# Patient Record
Sex: Female | Born: 1935 | Race: White | Hispanic: No | Marital: Married | Smoking: Never smoker
Health system: Southern US, Community
[De-identification: ages and names within clinical notes are randomized; demographics above are authoritative.]

## PROBLEM LIST (undated history)

## (undated) ENCOUNTER — Emergency Department: Discharge status: Absent without leave | Payer: Medicare HMO

## (undated) DIAGNOSIS — I10 Essential (primary) hypertension: Secondary | ICD-10-CM

## (undated) DIAGNOSIS — I2510 Atherosclerotic heart disease of native coronary artery without angina pectoris: Secondary | ICD-10-CM

## (undated) DIAGNOSIS — E7800 Pure hypercholesterolemia, unspecified: Secondary | ICD-10-CM

## (undated) HISTORY — PX: ABDOMINAL HYSTERECTOMY: SHX81

---

## 2003-11-03 ENCOUNTER — Observation Stay (HOSPITAL_COMMUNITY): Admission: EM | Admit: 2003-11-03 | Discharge: 2003-11-05 | Payer: Self-pay | Admitting: *Deleted

## 2006-06-21 ENCOUNTER — Ambulatory Visit: Payer: Self-pay | Admitting: Internal Medicine

## 2007-09-24 ENCOUNTER — Ambulatory Visit: Payer: Self-pay | Admitting: Cardiovascular Disease

## 2007-12-18 ENCOUNTER — Ambulatory Visit: Payer: Self-pay | Admitting: Internal Medicine

## 2009-02-06 ENCOUNTER — Ambulatory Visit: Payer: Self-pay | Admitting: Internal Medicine

## 2010-02-10 ENCOUNTER — Ambulatory Visit: Payer: Self-pay | Admitting: Internal Medicine

## 2011-02-23 ENCOUNTER — Ambulatory Visit: Payer: Self-pay | Admitting: Internal Medicine

## 2012-03-06 ENCOUNTER — Ambulatory Visit: Payer: Self-pay | Admitting: Internal Medicine

## 2013-03-07 ENCOUNTER — Ambulatory Visit: Payer: Self-pay | Admitting: Internal Medicine

## 2014-03-10 ENCOUNTER — Ambulatory Visit: Payer: Self-pay | Admitting: Internal Medicine

## 2015-01-28 ENCOUNTER — Other Ambulatory Visit: Payer: Self-pay | Admitting: Internal Medicine

## 2015-01-28 DIAGNOSIS — Z1231 Encounter for screening mammogram for malignant neoplasm of breast: Secondary | ICD-10-CM

## 2015-03-16 ENCOUNTER — Other Ambulatory Visit: Payer: Self-pay | Admitting: Internal Medicine

## 2015-03-16 ENCOUNTER — Ambulatory Visit
Admission: RE | Admit: 2015-03-16 | Discharge: 2015-03-16 | Disposition: A | Discharge status: Home / Self Care | Payer: Medicare HMO | Source: Ambulatory Visit | Attending: Internal Medicine | Admitting: Internal Medicine

## 2015-03-16 DIAGNOSIS — Z1231 Encounter for screening mammogram for malignant neoplasm of breast: Secondary | ICD-10-CM

## 2016-02-09 ENCOUNTER — Other Ambulatory Visit: Payer: Self-pay | Admitting: Internal Medicine

## 2016-02-09 DIAGNOSIS — Z1231 Encounter for screening mammogram for malignant neoplasm of breast: Secondary | ICD-10-CM

## 2016-03-16 ENCOUNTER — Ambulatory Visit
Admission: RE | Admit: 2016-03-16 | Discharge: 2016-03-16 | Disposition: A | Discharge status: Home / Self Care | Payer: Medicare HMO | Source: Ambulatory Visit | Attending: Internal Medicine | Admitting: Internal Medicine

## 2016-03-16 DIAGNOSIS — Z1231 Encounter for screening mammogram for malignant neoplasm of breast: Secondary | ICD-10-CM | POA: Diagnosis not present

## 2016-12-12 ENCOUNTER — Other Ambulatory Visit: Payer: Self-pay | Admitting: Internal Medicine

## 2016-12-12 DIAGNOSIS — Z1231 Encounter for screening mammogram for malignant neoplasm of breast: Secondary | ICD-10-CM

## 2017-03-21 ENCOUNTER — Other Ambulatory Visit: Payer: Self-pay | Admitting: Internal Medicine

## 2017-03-21 ENCOUNTER — Ambulatory Visit
Admission: RE | Admit: 2017-03-21 | Discharge: 2017-03-21 | Disposition: A | Discharge status: Home / Self Care | Payer: Medicare HMO | Source: Ambulatory Visit | Attending: Internal Medicine | Admitting: Internal Medicine

## 2017-03-21 DIAGNOSIS — Z1231 Encounter for screening mammogram for malignant neoplasm of breast: Secondary | ICD-10-CM | POA: Diagnosis not present

## 2018-01-22 ENCOUNTER — Other Ambulatory Visit: Payer: Self-pay | Admitting: Family

## 2018-01-22 DIAGNOSIS — Z1231 Encounter for screening mammogram for malignant neoplasm of breast: Secondary | ICD-10-CM

## 2018-03-22 ENCOUNTER — Ambulatory Visit
Admission: RE | Admit: 2018-03-22 | Discharge: 2018-03-22 | Disposition: A | Discharge status: Home / Self Care | Payer: Medicare HMO | Source: Ambulatory Visit | Attending: Family | Admitting: Family

## 2018-03-22 DIAGNOSIS — Z1231 Encounter for screening mammogram for malignant neoplasm of breast: Secondary | ICD-10-CM | POA: Insufficient documentation

## 2019-01-21 ENCOUNTER — Other Ambulatory Visit: Payer: Self-pay | Admitting: Internal Medicine

## 2019-01-21 DIAGNOSIS — Z1231 Encounter for screening mammogram for malignant neoplasm of breast: Secondary | ICD-10-CM

## 2019-03-25 ENCOUNTER — Ambulatory Visit
Admission: RE | Admit: 2019-03-25 | Discharge: 2019-03-25 | Disposition: A | Discharge status: Home / Self Care | Payer: Medicare HMO | Source: Ambulatory Visit | Attending: Internal Medicine | Admitting: Internal Medicine

## 2019-03-25 DIAGNOSIS — Z1231 Encounter for screening mammogram for malignant neoplasm of breast: Secondary | ICD-10-CM | POA: Diagnosis not present

## 2020-02-13 ENCOUNTER — Other Ambulatory Visit: Payer: Self-pay | Admitting: Internal Medicine

## 2020-02-13 DIAGNOSIS — Z1231 Encounter for screening mammogram for malignant neoplasm of breast: Secondary | ICD-10-CM

## 2020-03-25 ENCOUNTER — Other Ambulatory Visit: Payer: Self-pay

## 2020-03-25 ENCOUNTER — Ambulatory Visit
Admission: RE | Admit: 2020-03-25 | Discharge: 2020-03-25 | Disposition: A | Discharge status: Home / Self Care | Payer: Medicare HMO | Source: Ambulatory Visit | Attending: Internal Medicine | Admitting: Internal Medicine

## 2020-03-25 DIAGNOSIS — Z1231 Encounter for screening mammogram for malignant neoplasm of breast: Secondary | ICD-10-CM | POA: Insufficient documentation

## 2020-09-24 DIAGNOSIS — R7302 Impaired glucose tolerance (oral): Secondary | ICD-10-CM | POA: Diagnosis not present

## 2020-09-24 DIAGNOSIS — I2510 Atherosclerotic heart disease of native coronary artery without angina pectoris: Secondary | ICD-10-CM | POA: Diagnosis not present

## 2020-09-24 DIAGNOSIS — E559 Vitamin D deficiency, unspecified: Secondary | ICD-10-CM | POA: Diagnosis not present

## 2020-09-24 DIAGNOSIS — E782 Mixed hyperlipidemia: Secondary | ICD-10-CM | POA: Diagnosis not present

## 2020-09-24 DIAGNOSIS — I10 Essential (primary) hypertension: Secondary | ICD-10-CM | POA: Diagnosis not present

## 2020-11-13 DIAGNOSIS — R002 Palpitations: Secondary | ICD-10-CM | POA: Diagnosis not present

## 2020-11-13 DIAGNOSIS — I10 Essential (primary) hypertension: Secondary | ICD-10-CM | POA: Diagnosis not present

## 2020-11-13 DIAGNOSIS — I340 Nonrheumatic mitral (valve) insufficiency: Secondary | ICD-10-CM | POA: Diagnosis not present

## 2020-11-13 DIAGNOSIS — E782 Mixed hyperlipidemia: Secondary | ICD-10-CM | POA: Diagnosis not present

## 2020-11-13 DIAGNOSIS — E669 Obesity, unspecified: Secondary | ICD-10-CM | POA: Diagnosis not present

## 2020-11-13 DIAGNOSIS — I2510 Atherosclerotic heart disease of native coronary artery without angina pectoris: Secondary | ICD-10-CM | POA: Diagnosis not present

## 2020-12-24 DIAGNOSIS — I340 Nonrheumatic mitral (valve) insufficiency: Secondary | ICD-10-CM | POA: Diagnosis not present

## 2020-12-28 DIAGNOSIS — I2510 Atherosclerotic heart disease of native coronary artery without angina pectoris: Secondary | ICD-10-CM | POA: Diagnosis not present

## 2020-12-31 DIAGNOSIS — E782 Mixed hyperlipidemia: Secondary | ICD-10-CM | POA: Diagnosis not present

## 2020-12-31 DIAGNOSIS — R0602 Shortness of breath: Secondary | ICD-10-CM | POA: Diagnosis not present

## 2020-12-31 DIAGNOSIS — I2510 Atherosclerotic heart disease of native coronary artery without angina pectoris: Secondary | ICD-10-CM | POA: Diagnosis not present

## 2020-12-31 DIAGNOSIS — R002 Palpitations: Secondary | ICD-10-CM | POA: Diagnosis not present

## 2020-12-31 DIAGNOSIS — I10 Essential (primary) hypertension: Secondary | ICD-10-CM | POA: Diagnosis not present

## 2021-03-15 ENCOUNTER — Other Ambulatory Visit: Payer: Self-pay | Admitting: Internal Medicine

## 2021-03-15 DIAGNOSIS — Z1231 Encounter for screening mammogram for malignant neoplasm of breast: Secondary | ICD-10-CM

## 2021-03-15 DIAGNOSIS — I10 Essential (primary) hypertension: Secondary | ICD-10-CM | POA: Diagnosis not present

## 2021-03-15 DIAGNOSIS — I2510 Atherosclerotic heart disease of native coronary artery without angina pectoris: Secondary | ICD-10-CM | POA: Diagnosis not present

## 2021-03-15 DIAGNOSIS — E559 Vitamin D deficiency, unspecified: Secondary | ICD-10-CM | POA: Diagnosis not present

## 2021-03-15 DIAGNOSIS — E782 Mixed hyperlipidemia: Secondary | ICD-10-CM | POA: Diagnosis not present

## 2021-03-15 DIAGNOSIS — R7302 Impaired glucose tolerance (oral): Secondary | ICD-10-CM | POA: Diagnosis not present

## 2021-03-16 DIAGNOSIS — H40003 Preglaucoma, unspecified, bilateral: Secondary | ICD-10-CM | POA: Diagnosis not present

## 2021-03-22 DIAGNOSIS — I10 Essential (primary) hypertension: Secondary | ICD-10-CM | POA: Diagnosis not present

## 2021-03-22 DIAGNOSIS — R7302 Impaired glucose tolerance (oral): Secondary | ICD-10-CM | POA: Diagnosis not present

## 2021-03-22 DIAGNOSIS — I2510 Atherosclerotic heart disease of native coronary artery without angina pectoris: Secondary | ICD-10-CM | POA: Diagnosis not present

## 2021-03-22 DIAGNOSIS — E782 Mixed hyperlipidemia: Secondary | ICD-10-CM | POA: Diagnosis not present

## 2021-03-22 DIAGNOSIS — E559 Vitamin D deficiency, unspecified: Secondary | ICD-10-CM | POA: Diagnosis not present

## 2021-03-24 DIAGNOSIS — Z0100 Encounter for examination of eyes and vision without abnormal findings: Secondary | ICD-10-CM | POA: Diagnosis not present

## 2021-03-24 DIAGNOSIS — H2513 Age-related nuclear cataract, bilateral: Secondary | ICD-10-CM | POA: Diagnosis not present

## 2021-03-26 ENCOUNTER — Ambulatory Visit
Admission: RE | Admit: 2021-03-26 | Discharge: 2021-03-26 | Disposition: A | Discharge status: Home / Self Care | Payer: Medicare HMO | Source: Ambulatory Visit | Attending: Internal Medicine | Admitting: Internal Medicine

## 2021-03-26 ENCOUNTER — Other Ambulatory Visit: Payer: Self-pay

## 2021-03-26 DIAGNOSIS — Z1231 Encounter for screening mammogram for malignant neoplasm of breast: Secondary | ICD-10-CM | POA: Insufficient documentation

## 2021-04-01 DIAGNOSIS — I10 Essential (primary) hypertension: Secondary | ICD-10-CM | POA: Diagnosis not present

## 2021-04-01 DIAGNOSIS — E669 Obesity, unspecified: Secondary | ICD-10-CM | POA: Diagnosis not present

## 2021-04-01 DIAGNOSIS — I340 Nonrheumatic mitral (valve) insufficiency: Secondary | ICD-10-CM | POA: Diagnosis not present

## 2021-04-01 DIAGNOSIS — I2510 Atherosclerotic heart disease of native coronary artery without angina pectoris: Secondary | ICD-10-CM | POA: Diagnosis not present

## 2021-04-01 DIAGNOSIS — R002 Palpitations: Secondary | ICD-10-CM | POA: Diagnosis not present

## 2021-04-01 DIAGNOSIS — E782 Mixed hyperlipidemia: Secondary | ICD-10-CM | POA: Diagnosis not present

## 2021-08-02 DIAGNOSIS — E669 Obesity, unspecified: Secondary | ICD-10-CM | POA: Diagnosis not present

## 2021-08-02 DIAGNOSIS — I10 Essential (primary) hypertension: Secondary | ICD-10-CM | POA: Diagnosis not present

## 2021-08-02 DIAGNOSIS — I2510 Atherosclerotic heart disease of native coronary artery without angina pectoris: Secondary | ICD-10-CM | POA: Diagnosis not present

## 2021-08-02 DIAGNOSIS — E782 Mixed hyperlipidemia: Secondary | ICD-10-CM | POA: Diagnosis not present

## 2021-08-02 DIAGNOSIS — R002 Palpitations: Secondary | ICD-10-CM | POA: Diagnosis not present

## 2021-08-02 DIAGNOSIS — I340 Nonrheumatic mitral (valve) insufficiency: Secondary | ICD-10-CM | POA: Diagnosis not present

## 2021-08-28 ENCOUNTER — Encounter: Payer: Self-pay | Admitting: Intensive Care

## 2021-08-28 ENCOUNTER — Emergency Department
Admission: EM | Admit: 2021-08-28 | Discharge: 2021-08-28 | Disposition: A | Discharge status: Home / Self Care | Payer: Medicare HMO | Attending: Emergency Medicine | Admitting: Emergency Medicine

## 2021-08-28 ENCOUNTER — Other Ambulatory Visit: Payer: Self-pay

## 2021-08-28 DIAGNOSIS — I10 Essential (primary) hypertension: Secondary | ICD-10-CM | POA: Diagnosis not present

## 2021-08-28 DIAGNOSIS — U071 COVID-19: Secondary | ICD-10-CM | POA: Diagnosis not present

## 2021-08-28 DIAGNOSIS — R197 Diarrhea, unspecified: Secondary | ICD-10-CM | POA: Diagnosis present

## 2021-08-28 HISTORY — DX: Pure hypercholesterolemia, unspecified: E78.00

## 2021-08-28 HISTORY — DX: Essential (primary) hypertension: I10

## 2021-08-28 LAB — COMPREHENSIVE METABOLIC PANEL
ALT: 42 U/L (ref 0–44)
AST: 49 U/L — ABNORMAL HIGH (ref 15–41)
Albumin: 3.8 g/dL (ref 3.5–5.0)
Alkaline Phosphatase: 68 U/L (ref 38–126)
Anion gap: 7 (ref 5–15)
BUN: 16 mg/dL (ref 8–23)
CO2: 23 mmol/L (ref 22–32)
Calcium: 8.8 mg/dL — ABNORMAL LOW (ref 8.9–10.3)
Chloride: 104 mmol/L (ref 98–111)
Creatinine, Ser: 1.53 mg/dL — ABNORMAL HIGH (ref 0.44–1.00)
GFR, Estimated: 33 mL/min — ABNORMAL LOW (ref >60)
Glucose, Bld: 115 mg/dL — ABNORMAL HIGH (ref 70–99)
Potassium: 3.9 mmol/L (ref 3.5–5.1)
Sodium: 134 mmol/L — ABNORMAL LOW (ref 135–145)
Total Bilirubin: 0.6 mg/dL (ref 0.3–1.2)
Total Protein: 7.1 g/dL (ref 6.5–8.1)

## 2021-08-28 LAB — CBC
HCT: 41.7 % (ref 36.0–46.0)
Hemoglobin: 13.3 g/dL (ref 12.0–15.0)
MCH: 30.7 pg (ref 26.0–34.0)
MCHC: 31.9 g/dL (ref 30.0–36.0)
MCV: 96.3 fL (ref 80.0–100.0)
Platelets: 218 10*3/uL (ref 150–400)
RBC: 4.33 MIL/uL (ref 3.87–5.11)
RDW: 12.9 % (ref 11.5–15.5)
WBC: 5.5 10*3/uL (ref 4.0–10.5)
nRBC: 0 % (ref 0.0–0.2)

## 2021-08-28 LAB — LIPASE, BLOOD: Lipase: 72 U/L — ABNORMAL HIGH (ref 11–51)

## 2021-08-28 LAB — RESP PANEL BY RT-PCR (FLU A&B, COVID) ARPGX2
Influenza A by PCR: NEGATIVE
Influenza B by PCR: NEGATIVE
SARS Coronavirus 2 by RT PCR: POSITIVE — AB

## 2021-08-28 MED ORDER — ACETAMINOPHEN 500 MG PO TABS
1000.0000 mg | ORAL_TABLET | Freq: Once | ORAL | Status: AC
  Administered 2021-08-28: 1000 mg via ORAL
  Filled 2021-08-28: qty 2

## 2021-08-28 MED ORDER — SODIUM CHLORIDE 0.9 % IV BOLUS
1000.0000 mL | Freq: Once | INTRAVENOUS | Status: AC
  Administered 2021-08-28: 1000 mL via INTRAVENOUS

## 2021-08-28 NOTE — ED Provider Notes (Signed)
? ?[facility] ?Provider Note ? ? ? Event Date/Time  ? First MD Initiated Contact with Patient 08/28/21 1217   ?  (approximate) ? ? ?History  ? ?Cough and Diarrhea ? ? ?HPI ? ?Alexa Coleman is a 88 y.o. female with past medical history of hypertension hyperlipidemia presents with body aches diarrhea nasal congestion and fatigue.  Symptoms started last Saturday about a week ago.  She for started having cough and sore throat.  Then later in the week she developed multiple episodes of diarrhea.  Described as watery nonbloody diarrhea about 10 episodes per day.  This is significantly slowed down and has not had diarrhea since yesterday.  Has had some nausea but no vomiting denies significant abdominal pain no urinary symptoms.  Did have subjective fever did not take her temperature.  Denies ongoing sore throat cough or shortness of breath.  Patient's husband has the same symptoms.  She is feeling just generally weak and fatigued somewhat foggy. ?  ? ?Past Medical History:  ?Diagnosis Date  ? High cholesterol   ? Hypertension   ? ? ?There are no problems to display for this patient. ? ? ? ?Physical Exam  ?Triage Vital Signs: ?ED Triage Vitals  ?Enc Vitals Group  ?   BP 08/28/21 1148 (!) 152/61  ?   Pulse Rate 08/28/21 1148 63  ?   Resp 08/28/21 1148 16  ?   Temp 08/28/21 1148 98 ?F (36.7 ?C)  ?   Temp Source 08/28/21 1148 Oral  ?   SpO2 08/28/21 1148 95 %  ?   Weight 08/28/21 1149 162 lb (73.5 kg)  ?   Height 08/28/21 1149 5\' 2"  (1.575 m)  ?   Head Circumference --   ?   Peak Flow --   ?   Pain Score 08/28/21 1149 0  ?   Pain Loc --   ?   Pain Edu? --   ?   Excl. in GC? --   ? ? ?Most recent vital signs: ?Vitals:  ? 08/28/21 1148  ?BP: (!) 152/61  ?Pulse: 63  ?Resp: 16  ?Temp: 98 ?F (36.7 ?C)  ?SpO2: 95%  ? ? ? ?General: Awake, no distress.  ?CV:  Good peripheral perfusion.  ?Resp:  Normal effort.  Lungs are clear no increased work of breathing ?Abd:  No distention.  Diminished soft and nontender  throughout ?Neuro:             Awake, Alert, Oriented x 3  ?Other:  Normal posterior oropharynx, dry mucous membranes ? ? ?ED Results / Procedures / Treatments  ?Labs ?(all labs ordered are listed, but only abnormal results are displayed) ?Labs Reviewed  ?RESP PANEL BY RT-PCR (FLU A&B, COVID) ARPGX2 - Abnormal; Notable for the following components:  ?    Result Value  ? SARS Coronavirus 2 by RT PCR POSITIVE (*)   ? All other components within normal limits  ?LIPASE, BLOOD - Abnormal; Notable for the following components:  ? Lipase 72 (*)   ? All other components within normal limits  ?COMPREHENSIVE METABOLIC PANEL - Abnormal; Notable for the following components:  ? Sodium 134 (*)   ? Glucose, Bld 115 (*)   ? Creatinine, Ser 1.53 (*)   ? Calcium 8.8 (*)   ? AST 49 (*)   ? GFR, Estimated 33 (*)   ? All other components within normal limits  ?CBC  ?URINALYSIS, ROUTINE W REFLEX MICROSCOPIC  ? ? ? ?EKG ? ? ? ?  RADIOLOGY ? ? ? ?PROCEDURES: ? ?Critical Care performed: No ? ?Procedures ? ? ?MEDICATIONS ORDERED IN ED: ?Medications  ?sodium chloride 0.9 % bolus 1,000 mL (1,000 mLs Intravenous New Bag/Given 08/28/21 1252)  ?acetaminophen (TYLENOL) tablet 1,000 mg (1,000 mg Oral Given 08/28/21 1253)  ? ? ? ?IMPRESSION / MDM / ASSESSMENT AND PLAN / ED COURSE  ?I reviewed the triage vital signs and the nursing notes. ?             ?               ? ?Differential diagnosis includes, but is not limited to, viral respiratory illness, GI virus, bacterial diarrhea, AKI, dehydration ? ?Patient is a 88 year old female presenting with multiple nonspecific symptoms including diarrhea cough congestion sore throat fatigue weakness.  Patient's husband has really the exact same symptoms.  She has been sick for about a week and seems to be improving.  Initially had more respiratory symptoms these have improved and then later in the week had multiple episodes of diarrhea per day but has not had diarrhea since yesterday.  She still tolerating p.o.  but has not eaten much feels just generally weak and fatigued.  Vital signs within normal limits he appears well and nontoxic but does appear dry.  Reviewed her labs which are overall reassuring normal hemoglobin no white count, creatinine is 1.53 we have nothing to compare to.  Will give Tylenol and a liter of fluid.  Ultimately I suspect viral illness we will send for COVID and flu.  She seems to be at the end of her illness and I think with some hydration that she will likely be able to be discharged. ? ?Patient is COVID-positive as is her husband.  Given her stable vitals and the fact that she is actually improving and is at the end of her illness I think she is appropriate for outpatient management.  Recommended staying hydrated and Tylenol for body aches and ear pain.  We discussed return precautions. ?  ? ? ?FINAL CLINICAL IMPRESSION(S) / ED DIAGNOSES  ? ?Final diagnoses:  ?COVID-19  ? ? ? ?Rx / DC Orders  ? ?ED Discharge Orders   ? ? None  ? ?  ? ? ? ?Note:  This document was prepared using Dragon voice recognition software and may include unintentional dictation errors. ?  ?Georga Hacking, MD ?08/28/21 1419 ? ?

## 2021-08-28 NOTE — ED Notes (Signed)
Pt to ED for diarrhea since Monday, last episode yesterday. Pt has cough since about 1 week and feels weak and has body aches.  Pt was having about 10 episodes diarrhea/day when it first started.  ?

## 2021-08-28 NOTE — ED Triage Notes (Signed)
Patient c/o cough and diarrhea X1 week. Reports fever in beginning that has now subsided ?

## 2021-08-28 NOTE — ED Notes (Signed)
See triage note  presents with body aches,cough and diarrhea since last Saturday  had low grade temp several days ago but is currently afebrile   last diarrhea stool was yesterday ?

## 2021-09-09 DIAGNOSIS — I2510 Atherosclerotic heart disease of native coronary artery without angina pectoris: Secondary | ICD-10-CM | POA: Diagnosis not present

## 2021-09-09 DIAGNOSIS — I10 Essential (primary) hypertension: Secondary | ICD-10-CM | POA: Diagnosis not present

## 2021-09-09 DIAGNOSIS — E669 Obesity, unspecified: Secondary | ICD-10-CM | POA: Diagnosis not present

## 2021-09-09 DIAGNOSIS — R197 Diarrhea, unspecified: Secondary | ICD-10-CM | POA: Diagnosis not present

## 2021-09-09 DIAGNOSIS — E782 Mixed hyperlipidemia: Secondary | ICD-10-CM | POA: Diagnosis not present

## 2021-09-09 DIAGNOSIS — R110 Nausea: Secondary | ICD-10-CM | POA: Diagnosis not present

## 2021-09-09 DIAGNOSIS — U071 COVID-19: Secondary | ICD-10-CM | POA: Diagnosis not present

## 2021-09-23 DIAGNOSIS — I10 Essential (primary) hypertension: Secondary | ICD-10-CM | POA: Diagnosis not present

## 2021-09-23 DIAGNOSIS — E782 Mixed hyperlipidemia: Secondary | ICD-10-CM | POA: Diagnosis not present

## 2021-09-23 DIAGNOSIS — E559 Vitamin D deficiency, unspecified: Secondary | ICD-10-CM | POA: Diagnosis not present

## 2021-09-23 DIAGNOSIS — R7302 Impaired glucose tolerance (oral): Secondary | ICD-10-CM | POA: Diagnosis not present

## 2021-12-02 DIAGNOSIS — I10 Essential (primary) hypertension: Secondary | ICD-10-CM | POA: Diagnosis not present

## 2021-12-02 DIAGNOSIS — E669 Obesity, unspecified: Secondary | ICD-10-CM | POA: Diagnosis not present

## 2021-12-02 DIAGNOSIS — R002 Palpitations: Secondary | ICD-10-CM | POA: Diagnosis not present

## 2021-12-02 DIAGNOSIS — I2510 Atherosclerotic heart disease of native coronary artery without angina pectoris: Secondary | ICD-10-CM | POA: Diagnosis not present

## 2021-12-02 DIAGNOSIS — I340 Nonrheumatic mitral (valve) insufficiency: Secondary | ICD-10-CM | POA: Diagnosis not present

## 2021-12-02 DIAGNOSIS — E782 Mixed hyperlipidemia: Secondary | ICD-10-CM | POA: Diagnosis not present

## 2021-12-31 DIAGNOSIS — R7302 Impaired glucose tolerance (oral): Secondary | ICD-10-CM | POA: Diagnosis not present

## 2021-12-31 DIAGNOSIS — I10 Essential (primary) hypertension: Secondary | ICD-10-CM | POA: Diagnosis not present

## 2021-12-31 DIAGNOSIS — Z0000 Encounter for general adult medical examination without abnormal findings: Secondary | ICD-10-CM | POA: Diagnosis not present

## 2021-12-31 DIAGNOSIS — I2510 Atherosclerotic heart disease of native coronary artery without angina pectoris: Secondary | ICD-10-CM | POA: Diagnosis not present

## 2021-12-31 DIAGNOSIS — E782 Mixed hyperlipidemia: Secondary | ICD-10-CM | POA: Diagnosis not present

## 2021-12-31 DIAGNOSIS — M810 Age-related osteoporosis without current pathological fracture: Secondary | ICD-10-CM | POA: Diagnosis not present

## 2022-03-28 DIAGNOSIS — E559 Vitamin D deficiency, unspecified: Secondary | ICD-10-CM | POA: Diagnosis not present

## 2022-03-28 DIAGNOSIS — E782 Mixed hyperlipidemia: Secondary | ICD-10-CM | POA: Diagnosis not present

## 2022-03-28 DIAGNOSIS — R7302 Impaired glucose tolerance (oral): Secondary | ICD-10-CM | POA: Diagnosis not present

## 2022-03-28 DIAGNOSIS — I10 Essential (primary) hypertension: Secondary | ICD-10-CM | POA: Diagnosis not present

## 2022-04-04 DIAGNOSIS — I2510 Atherosclerotic heart disease of native coronary artery without angina pectoris: Secondary | ICD-10-CM | POA: Diagnosis not present

## 2022-04-04 DIAGNOSIS — I10 Essential (primary) hypertension: Secondary | ICD-10-CM | POA: Diagnosis not present

## 2022-04-04 DIAGNOSIS — E782 Mixed hyperlipidemia: Secondary | ICD-10-CM | POA: Diagnosis not present

## 2022-04-04 DIAGNOSIS — R002 Palpitations: Secondary | ICD-10-CM | POA: Diagnosis not present

## 2022-04-04 DIAGNOSIS — I340 Nonrheumatic mitral (valve) insufficiency: Secondary | ICD-10-CM | POA: Diagnosis not present

## 2022-04-04 DIAGNOSIS — E669 Obesity, unspecified: Secondary | ICD-10-CM | POA: Diagnosis not present

## 2022-04-07 DIAGNOSIS — H2513 Age-related nuclear cataract, bilateral: Secondary | ICD-10-CM | POA: Diagnosis not present

## 2022-05-02 DIAGNOSIS — I10 Essential (primary) hypertension: Secondary | ICD-10-CM | POA: Diagnosis not present

## 2022-05-02 DIAGNOSIS — R7302 Impaired glucose tolerance (oral): Secondary | ICD-10-CM | POA: Diagnosis not present

## 2022-05-02 DIAGNOSIS — I2510 Atherosclerotic heart disease of native coronary artery without angina pectoris: Secondary | ICD-10-CM | POA: Diagnosis not present

## 2022-05-02 DIAGNOSIS — E782 Mixed hyperlipidemia: Secondary | ICD-10-CM | POA: Diagnosis not present

## 2022-06-06 ENCOUNTER — Other Ambulatory Visit: Payer: Self-pay | Admitting: Internal Medicine

## 2022-08-05 ENCOUNTER — Ambulatory Visit: Payer: Medicare HMO | Admitting: Cardiovascular Disease

## 2022-08-05 ENCOUNTER — Encounter: Payer: Self-pay | Admitting: Cardiovascular Disease

## 2022-08-05 VITALS — BP 134/80 | HR 68 | Ht 62.0 in | Wt 168.2 lb

## 2022-08-05 DIAGNOSIS — E782 Mixed hyperlipidemia: Secondary | ICD-10-CM | POA: Diagnosis not present

## 2022-08-05 DIAGNOSIS — I2510 Atherosclerotic heart disease of native coronary artery without angina pectoris: Secondary | ICD-10-CM

## 2022-08-05 DIAGNOSIS — I340 Nonrheumatic mitral (valve) insufficiency: Secondary | ICD-10-CM

## 2022-08-05 DIAGNOSIS — I10 Essential (primary) hypertension: Secondary | ICD-10-CM

## 2022-08-05 NOTE — Progress Notes (Signed)
Cardiology Office Note   Date:  08/05/2022   ID:  Alexa Coleman, DOB March 28, 1934, MRN [id number]  PCP:  Margaretann Loveless, MD  Cardiologist:  Adrian Blackwater, MD      History of Present Illness: Alexa Coleman is a 89 y.o. female who presents for  Chief Complaint  Patient presents with   Follow-up    4 month follow up    No chest pain or SOB      Past Medical History:  Diagnosis Date   High cholesterol    Hypertension      Past Surgical History:  Procedure Laterality Date   ABDOMINAL HYSTERECTOMY       Current Outpatient Medications  Medication Sig Dispense Refill   alendronate (FOSAMAX) 70 MG tablet Take 70 mg by mouth once a week. Take with a full glass of water on an empty stomach.     atorvastatin (LIPITOR) 40 MG tablet Take 40 mg by mouth daily.     clopidogrel (PLAVIX) 75 MG tablet Take 75 mg by mouth daily.     losartan (COZAAR) 100 MG tablet Take 100 mg by mouth daily.     metoprolol succinate (TOPROL-XL) 50 MG 24 hr tablet Take 50 mg by mouth daily. Take with or immediately following a meal.     Vitamin D, Ergocalciferol, (DRISDOL) 1.25 MG (50000 UNIT) CAPS capsule TAKE 1 CAPSULE ONE TIME WEEKLY 12 capsule 3   No current facility-administered medications for this visit.    Allergies:   Patient has no known allergies.    Social History:   reports that she has never smoked. She has never used smokeless tobacco. She reports that she does not drink alcohol and does not use drugs.   Family History:  family history is not on file.    ROS:     Review of Systems  Constitutional: Negative.   HENT: Negative.    Eyes: Negative.   Respiratory: Negative.    Gastrointestinal: Negative.   Genitourinary: Negative.   Musculoskeletal: Negative.   Skin: Negative.   Neurological: Negative.   Endo/Heme/Allergies: Negative.   Psychiatric/Behavioral: Negative.    All other systems reviewed and are negative.     All other systems are reviewed and negative.     PHYSICAL EXAM: VS:  BP 134/80   Pulse 68   Ht 5\' 2"  (1.575 m)   Wt 168 lb 3.2 oz (76.3 kg)   SpO2 96%   BMI 30.76 kg/m  , BMI Body mass index is 30.76 kg/m. Last weight:  Wt Readings from Last 3 Encounters:  08/05/22 168 lb 3.2 oz (76.3 kg)  08/28/21 162 lb (73.5 kg)     Physical Exam Constitutional:      Appearance: Normal appearance.  Cardiovascular:     Rate and Rhythm: Normal rate and regular rhythm.     Heart sounds: Normal heart sounds.  Pulmonary:     Effort: Pulmonary effort is normal.     Breath sounds: Normal breath sounds.  Musculoskeletal:     Right lower leg: No edema.     Left lower leg: No edema.  Neurological:     Mental Status: She is alert.       EKG:   Recent Labs: 08/28/2021: ALT 42; BUN 16; Creatinine, Ser 1.53; Hemoglobin 13.3; Platelets 218; Potassium 3.9; Sodium 134    Lipid Panel No results found for: "CHOL", "TRIG", "HDL", "CHOLHDL", "VLDL", "LDLCALC", "LDLDIRECT"   TESTS  [facility] [address] [place], [place] [address] [phone] STUDY:  Gated Stress / Rest Myocardial Perfusion Imaging Tomographic (SPECT) Including attenuation correction Wall Motion, Left Ventricular Ejection Fraction By Gated Technique.Treadmill Stress Test. SEX: Female  WEIGHT: 183 lbs  HEIGHT: 72 in   ARMS UP: YES/NO                                                                        REFERRING PHYSICIAN: Dr.Danni Shima Welton Flakes                                                                                                                                                                                                                       INDICATION FOR STUDY: CAD                                                                                                                                                                                                                      TECHNIQUE:  Approximately 20 minutes following the intravenous administration of 9.5 mCi of Tc-28m Sestamibi after stress testing in a reclined supine position with arms above their head if able to do so, gated SPECT imaging of the heart was performed. After  about a 2hr break, the patient was injected intravenously with 32.3 mCi of Tc-65m Sestamibi.  Approximately 45 minutes later in the same position as stress imaging SPECT rest imaging of the heart was performed.  STRESS BY:  Adrian Blackwater, MD PROTOCOL:  Smitty Cords                                                                                       MAX PRED HR: 134                   85%: 114               75%: 101                                                                                                                   RESTING BP:  132/72  RESTING HR: 60  PEAK BP:  164/86   PEAK HR:  117 (87%)                                                                   EXERCISE DURATION: 6:00                                             METS: 7.0     REASON FOR TEST TERMINATION: Target reached/1 min post injection                                                                                                                                  SYMPTOMS: None  [facility] TREADMILL SCORE: 6  RISK: Low                                                                                                                                                                                                            EKG RESULTS: NSR. 89/min. RBBB. Non specific ST/T wave changes, no significant ST changes at peak exercise.                                                             IMAGE QUALITY: Good  PERFUSION/WALL MOTION FINDINGS:  EF = 57%. Small mild fixed Basal, mid, apical inferior wall defects, apex (17) defect, normal wall motion.                                                                          IMPRESSION:  Possible ischemia in the RCA territory with normal LVEF, correlate clinically.                                                                                                                                                                                                                                                                                        Adrian Blackwater, MD Stress Interpreting Physician / Nuclear Interpreting Physician                         Adrian Blackwater MD  Electronically signed by: Adrian Blackwater     Date: 07/16/2021 11:19 REASON FOR VISIT  Visit for: Echocardiogram/I 10  Sex:   Female   wt=  186  lbs.  BP=120/68  Height= 71   inches.        TESTS  Imaging: Echocardiogram:  An echocardiogram in (2-d) mode was performed and in Doppler mode with color flow velocity mapping was performed. The aortic valve cusps are abnormal 1.0   cm, flow velocity 1.30  m/s, and systolic calculated mean flow gradient 4  mmHg. Mitral valve diastolic peak flow velocity E .738    m/s and E/A ratio 1.1. Aortic root diameter 3.7   cm. The LVOT internal diameter 3.0 cm and flow velocity was abnormal 1.8  m/s. LV systolic dimension 2.29   cm, diastolic 3.15  cm, posterior wall thickness 1.21   cm, fractional shortening 27.3 %, and EF 54.6 %. IVS thickness 1.35   cm. LA dimension 6.8 cm. Mitral Valve has Mild Regurgitation. Tricuspid Valve has  Trace Regurgitation.     ASSESSMENT  Technically adequate study.  Normal chamber sizes.  Borderline left ventricular systolic function.  Mild left ventricular hypertrophy with GRADE (relaxation abnormality psuedonormalization restrictive  physiology) diastolic dysfunction.  Normal right ventricular systolic function.  Normal right ventricular diastolic function.  Normal left ventricular wall motion.  Normal right ventricular wall motion.  Trace tricuspid regurgitation.  Normal pulmonary artery pressure.  Mild mitral regurgitation.  No pericardial effusion.  Mild LVH.     THERAPY   Referring physician: Laurier Nancy  Sonographer: Adrian Blackwater.      Adrian Blackwater MD  Electronically signed by: Adrian Blackwater     Date: 06/30/2021 09:07 REASON FOR VISIT  Referred by Adrian Blackwater.        TESTS  Imaging: Computed Tomographic Angiography:  Cardiac multidetector CT was performed paying particular attention to the coronary arteries for the diagnosis of: Chest pain. Diagnostic Drugs:  Administered iohexol (Omnipaque) through an antecubital vein and images from the examination were analyzed for the presence and extent of coronary artery disease, using 3D image processing software. 100 mL of non-ionic contrast (Omnipaque) was used.        ASSESSMENT   The left main artery was abnormal 1  The proximal LAD artery are abnormal 1  The mid-LAD artery was abnormal 2  The distal LAD artery was abnormal 1  The proximal circumflex artery was abnormal 1  The distal circumflex artery was abnormal 1  The first obtuse marginal branch artery (OM-1) was abnormal 1  The proximal right coronary artery (RCA) was abnormal 2  The mid right coronary artery (RCA) was abnormal 2  The distal right coronary artery (RCA) was abnormal 3  The posterior descending coronary arteries were normal 0     TEST CONCLUSIONS  1 - Calcium score is 338.7.   2 - Right dominant system.  3 - Mild LAD and LCX disease but severely calcified RCA with distal LCX moderate disease.  Unchanged from 2010 cath.  Treat medically by adding Isosoride.     Adrian Blackwater, MD  Electronically signed by: Adrian Blackwater     Date: 03/21/2014 14:20  Other studies  Reviewed: Additional studies/ records that were reviewed today include:  Review of the above records demonstrates:       No data to display            ASSESSMENT AND PLAN:    ICD-10-CM   1. Coronary artery disease involving native coronary artery of native heart without angina pectoris  I25.10    Had cardiac cath 2010 with 60% mid RCA/FFR did not show need for PCI. Treated medically    2. Mixed hyperlipidemia  E78.2     3. Primary hypertension  I10     4. Nonrheumatic mitral valve regurgitation  I34.0    mild MR       Problem List Items Addressed This Visit   None Visit Diagnoses     Coronary artery disease involving native coronary artery of native heart without angina pectoris    -  Primary   Had cardiac cath 2010 with 60% mid RCA/FFR did not show need for PCI. Treated medically   Mixed hyperlipidemia       Primary hypertension       Nonrheumatic mitral valve regurgitation       mild MR          Disposition:   Return in about 3 months (around 11/04/2022).  Total time spent: 30 minutes  Signed,  Adrian Blackwater, MD  08/05/2022 1:08 PM    [facility]

## 2022-10-25 ENCOUNTER — Other Ambulatory Visit: Payer: Medicare HMO

## 2022-10-25 ENCOUNTER — Other Ambulatory Visit: Payer: Self-pay | Admitting: Internal Medicine

## 2022-10-25 DIAGNOSIS — R7303 Prediabetes: Secondary | ICD-10-CM

## 2022-10-25 DIAGNOSIS — E559 Vitamin D deficiency, unspecified: Secondary | ICD-10-CM | POA: Diagnosis not present

## 2022-10-25 DIAGNOSIS — E782 Mixed hyperlipidemia: Secondary | ICD-10-CM | POA: Diagnosis not present

## 2022-10-26 LAB — CMP14+EGFR
ALT: 19 IU/L (ref 0–32)
AST: 18 IU/L (ref 0–40)
Albumin: 4 g/dL (ref 3.7–4.7)
Alkaline Phosphatase: 81 IU/L (ref 44–121)
BUN/Creatinine Ratio: 18 (ref 12–28)
BUN: 23 mg/dL (ref 8–27)
Bilirubin Total: 0.6 mg/dL (ref 0.0–1.2)
CO2: 24 mmol/L (ref 20–29)
Calcium: 9.3 mg/dL (ref 8.7–10.3)
Chloride: 103 mmol/L (ref 96–106)
Creatinine, Ser: 1.26 mg/dL — ABNORMAL HIGH (ref 0.57–1.00)
Globulin, Total: 2.7 g/dL (ref 1.5–4.5)
Glucose: 98 mg/dL (ref 70–99)
Potassium: 5.3 mmol/L — ABNORMAL HIGH (ref 3.5–5.2)
Sodium: 138 mmol/L (ref 134–144)
Total Protein: 6.7 g/dL (ref 6.0–8.5)
eGFR: 42 mL/min/{1.73_m2} — ABNORMAL LOW (ref >59)

## 2022-10-26 LAB — CBC WITH DIFF/PLATELET
Basophils Absolute: 0 10*3/uL (ref 0.0–0.2)
Basos: 1 %
EOS (ABSOLUTE): 0.3 10*3/uL (ref 0.0–0.4)
Eos: 3 %
Hematocrit: 40.3 % (ref 34.0–46.6)
Hemoglobin: 13.5 g/dL (ref 11.1–15.9)
Immature Grans (Abs): 0 10*3/uL (ref 0.0–0.1)
Immature Granulocytes: 0 %
Lymphocytes Absolute: 1.7 10*3/uL (ref 0.7–3.1)
Lymphs: 20 %
MCH: 32.1 pg (ref 26.6–33.0)
MCHC: 33.5 g/dL (ref 31.5–35.7)
MCV: 96 fL (ref 79–97)
Monocytes Absolute: 0.7 10*3/uL (ref 0.1–0.9)
Monocytes: 9 %
Neutrophils Absolute: 5.7 10*3/uL (ref 1.4–7.0)
Neutrophils: 67 %
Platelets: 242 10*3/uL (ref 150–450)
RBC: 4.2 x10E6/uL (ref 3.77–5.28)
RDW: 12.3 % (ref 11.7–15.4)
WBC: 8.4 10*3/uL (ref 3.4–10.8)

## 2022-10-26 LAB — LIPID PANEL
Chol/HDL Ratio: 3.6 ratio (ref 0.0–4.4)
Cholesterol, Total: 140 mg/dL (ref 100–199)
HDL: 39 mg/dL — ABNORMAL LOW (ref >39)
LDL Chol Calc (NIH): 70 mg/dL (ref 0–99)
Triglycerides: 182 mg/dL — ABNORMAL HIGH (ref 0–149)
VLDL Cholesterol Cal: 31 mg/dL (ref 5–40)

## 2022-10-26 LAB — VITAMIN D 25 HYDROXY (VIT D DEFICIENCY, FRACTURES): Vit D, 25-Hydroxy: 44.4 ng/mL (ref 30.0–100.0)

## 2022-10-26 LAB — HEMOGLOBIN A1C
Est. average glucose Bld gHb Est-mCnc: 117 mg/dL
Hgb A1c MFr Bld: 5.7 % — ABNORMAL HIGH (ref 4.8–5.6)

## 2022-10-31 ENCOUNTER — Ambulatory Visit (INDEPENDENT_AMBULATORY_CARE_PROVIDER_SITE_OTHER): Payer: Medicare HMO | Admitting: Internal Medicine

## 2022-10-31 ENCOUNTER — Encounter: Payer: Self-pay | Admitting: Internal Medicine

## 2022-10-31 VITALS — BP 118/82 | HR 70 | Ht 62.0 in | Wt 166.2 lb

## 2022-10-31 DIAGNOSIS — I2510 Atherosclerotic heart disease of native coronary artery without angina pectoris: Secondary | ICD-10-CM | POA: Diagnosis not present

## 2022-10-31 DIAGNOSIS — I340 Nonrheumatic mitral (valve) insufficiency: Secondary | ICD-10-CM | POA: Diagnosis not present

## 2022-10-31 DIAGNOSIS — E559 Vitamin D deficiency, unspecified: Secondary | ICD-10-CM | POA: Diagnosis not present

## 2022-10-31 DIAGNOSIS — Z1231 Encounter for screening mammogram for malignant neoplasm of breast: Secondary | ICD-10-CM | POA: Diagnosis not present

## 2022-10-31 DIAGNOSIS — I10 Essential (primary) hypertension: Secondary | ICD-10-CM | POA: Insufficient documentation

## 2022-10-31 DIAGNOSIS — E782 Mixed hyperlipidemia: Secondary | ICD-10-CM | POA: Diagnosis not present

## 2022-10-31 DIAGNOSIS — R7303 Prediabetes: Secondary | ICD-10-CM

## 2022-10-31 NOTE — Progress Notes (Signed)
Established Patient Office Visit  Subjective:  Patient ID: Alexa Coleman, female    DOB: Aug 19, 1933  Age: 89 y.o. MRN: [id number]  Chief Complaint  Patient presents with   Follow-up    6 month follow up    Patient comes in for her 29-month follow-up.  She is feeling well and has no complaints whatsoever.  Patient is in good spirits.  Had a labs done which were discussed today.  She agrees to get a mammogram, will send in the orders.    No other concerns at this time.   Past Medical History:  Diagnosis Date   High cholesterol    Hypertension     Past Surgical History:  Procedure Laterality Date   ABDOMINAL HYSTERECTOMY      Social History   Socioeconomic History   Marital status: Married    Spouse name: Not on file   Number of children: Not on file   Years of education: Not on file   Highest education level: Not on file  Occupational History   Not on file  Tobacco Use   Smoking status: Never   Smokeless tobacco: Never  Vaping Use   Vaping status: Never Used  Substance and Sexual Activity   Alcohol use: Never   Drug use: Never   Sexual activity: Not on file  Other Topics Concern   Not on file  Social History Narrative   Not on file   Social Determinants of Health   Financial Resource Strain: Not on file  Food Insecurity: Not on file  Transportation Needs: Not on file  Physical Activity: Not on file  Stress: Not on file  Social Connections: Not on file  Intimate Partner Violence: Not on file    Family History  Problem Relation Age of Onset   Breast cancer Neg Hx     No Known Allergies  Review of Systems  Constitutional: Negative.  Negative for chills, fever, malaise/fatigue and weight loss.  HENT: Negative.  Negative for ear discharge, ear pain and nosebleeds.   Eyes: Negative.   Respiratory: Negative.  Negative for cough and shortness of breath.   Cardiovascular: Negative.  Negative for chest pain, palpitations and leg swelling.   Gastrointestinal: Negative.  Negative for abdominal pain, constipation, diarrhea, heartburn, nausea and vomiting.  Genitourinary: Negative.  Negative for dysuria and flank pain.  Musculoskeletal: Negative.  Negative for joint pain and myalgias.  Skin: Negative.   Neurological: Negative.  Negative for dizziness and headaches.  Endo/Heme/Allergies: Negative.   Psychiatric/Behavioral: Negative.  Negative for depression and suicidal ideas. The patient is not nervous/anxious.        Objective:   BP 118/82   Pulse 70   Ht 5\' 2"  (1.575 m)   Wt 166 lb 3.2 oz (75.4 kg)   SpO2 96%   BMI 30.40 kg/m   Vitals:   10/31/22 1256  BP: 118/82  Pulse: 70  Height: 5\' 2"  (1.575 m)  Weight: 166 lb 3.2 oz (75.4 kg)  SpO2: 96%  BMI (Calculated): 30.39    Physical Exam Vitals and nursing note reviewed.  Constitutional:      Appearance: Normal appearance.  HENT:     Head: Normocephalic and atraumatic.     Nose: Nose normal.     Mouth/Throat:     Mouth: Mucous membranes are moist.     Pharynx: Oropharynx is clear.  Eyes:     Conjunctiva/sclera: Conjunctivae normal.     Pupils: Pupils are equal, round, and reactive to  light.  Cardiovascular:     Rate and Rhythm: Normal rate and regular rhythm.     Pulses: Normal pulses.     Heart sounds: Murmur heard.  Pulmonary:     Effort: Pulmonary effort is normal.     Breath sounds: Normal breath sounds. No wheezing.  Abdominal:     General: Bowel sounds are normal.     Palpations: Abdomen is soft. There is no mass.     Tenderness: There is no abdominal tenderness. There is no right CVA tenderness or left CVA tenderness.  Musculoskeletal:        General: Normal range of motion.     Cervical back: Normal range of motion.     Right lower leg: No edema.     Left lower leg: No edema.  Skin:    General: Skin is warm and dry.  Neurological:     General: No focal deficit present.     Mental Status: She is alert and oriented to person, place, and  time.  Psychiatric:        Mood and Affect: Mood normal.        Behavior: Behavior normal.      No results found for any visits on 10/31/22.  Recent Results (from the past 2160 hour(s))  CMP14+EGFR     Status: Abnormal   Collection Time: 10/25/22 12:15 PM  Result Value Ref Range   Glucose 98 70 - 99 mg/dL   BUN 23 8 - 27 mg/dL   Creatinine, Ser 4.09 (H) 0.57 - 1.00 mg/dL   eGFR 42 (L) >81 XB/JYN/8.29   BUN/Creatinine Ratio 18 12 - 28   Sodium 138 134 - 144 mmol/L   Potassium 5.3 (H) 3.5 - 5.2 mmol/L   Chloride 103 96 - 106 mmol/L   CO2 24 20 - 29 mmol/L   Calcium 9.3 8.7 - 10.3 mg/dL   Total Protein 6.7 6.0 - 8.5 g/dL   Albumin 4.0 3.7 - 4.7 g/dL   Globulin, Total 2.7 1.5 - 4.5 g/dL   Bilirubin Total 0.6 0.0 - 1.2 mg/dL   Alkaline Phosphatase 81 44 - 121 IU/L   AST 18 0 - 40 IU/L   ALT 19 0 - 32 IU/L  CBC With Diff/Platelet     Status: None   Collection Time: 10/25/22 12:15 PM  Result Value Ref Range   WBC 8.4 3.4 - 10.8 x10E3/uL   RBC 4.20 3.77 - 5.28 x10E6/uL   Hemoglobin 13.5 11.1 - 15.9 g/dL   Hematocrit 56.2 13.0 - 46.6 %   MCV 96 79 - 97 fL   MCH 32.1 26.6 - 33.0 pg   MCHC 33.5 31.5 - 35.7 g/dL   RDW 86.5 78.4 - 69.6 %   Platelets 242 150 - 450 x10E3/uL   Neutrophils 67 Not Estab. %   Lymphs 20 Not Estab. %   Monocytes 9 Not Estab. %   Eos 3 Not Estab. %   Basos 1 Not Estab. %   Neutrophils Absolute 5.7 1.4 - 7.0 x10E3/uL   Lymphocytes Absolute 1.7 0.7 - 3.1 x10E3/uL   Monocytes Absolute 0.7 0.1 - 0.9 x10E3/uL   EOS (ABSOLUTE) 0.3 0.0 - 0.4 x10E3/uL   Basophils Absolute 0.0 0.0 - 0.2 x10E3/uL   Immature Granulocytes 0 Not Estab. %   Immature Grans (Abs) 0.0 0.0 - 0.1 x10E3/uL  HgB A1c     Status: Abnormal   Collection Time: 10/25/22 12:15 PM  Result Value Ref Range   Hgb A1c  MFr Bld 5.7 (H) 4.8 - 5.6 %    Comment:          Prediabetes: 5.7 - 6.4          Diabetes: >6.4          Glycemic control for adults with diabetes: <7.0    Est. average glucose  Bld gHb Est-mCnc 117 mg/dL  Vitamin D (25 hydroxy)     Status: None   Collection Time: 10/25/22 12:15 PM  Result Value Ref Range   Vit D, 25-Hydroxy 44.4 30.0 - 100.0 ng/mL    Comment: Vitamin D deficiency has been defined by the Institute of Medicine and an Endocrine Society practice guideline as a level of serum 25-OH vitamin D less than 20 ng/mL (1,2). The Endocrine Society went on to further define vitamin D insufficiency as a level between 21 and 29 ng/mL (2). 1. IOM (Institute of Medicine). 2010. Dietary reference    intakes for calcium and D. [place]: The    [employer]. 2. Holick MF, Binkley Birdsong, Bischoff-Ferrari HA, et al.    Evaluation, treatment, and prevention of vitamin D    deficiency: an Endocrine Society clinical practice    guideline. JCEM. 2011 Jul; 96(7):1911-30.   Lipid Profile     Status: Abnormal   Collection Time: 10/25/22 12:15 PM  Result Value Ref Range   Cholesterol, Total 140 100 - 199 mg/dL   Triglycerides 782 (H) 0 - 149 mg/dL   HDL 39 (L) >95 mg/dL   VLDL Cholesterol Cal 31 5 - 40 mg/dL   LDL Chol Calc (NIH) 70 0 - 99 mg/dL   Chol/HDL Ratio 3.6 0.0 - 4.4 ratio    Comment:                                   T. Chol/HDL Ratio                                             Men  Women                               1/2 Avg.Risk  3.4    3.3                                   Avg.Risk  5.0    4.4                                2X Avg.Risk  9.6    7.1                                3X Avg.Risk 23.4   11.0       Assessment & Plan:  Patient to continue taking all her medications as such.  Schedule mammogram. Problem List Items Addressed This Visit     Mixed hyperlipidemia   Prediabetes   Vitamin D deficiency   Primary hypertension - Primary   Coronary artery disease involving native coronary artery of native heart without angina pectoris   Nonrheumatic mitral valve  regurgitation   Other Visit Diagnoses     Breast cancer screening by  mammogram       Relevant Orders   MM 3D SCREENING MAMMOGRAM BILATERAL BREAST       Return in about 6 months (around 05/03/2023).   Total time spent: 30 minutes  Margaretann Loveless, MD  10/31/2022   This document may have been prepared by [facility] Voice Recognition software and as such may include unintentional dictation errors.

## 2022-11-02 ENCOUNTER — Other Ambulatory Visit: Payer: Self-pay | Admitting: Internal Medicine

## 2022-11-04 ENCOUNTER — Encounter: Payer: Self-pay | Admitting: Cardiovascular Disease

## 2022-11-04 ENCOUNTER — Ambulatory Visit: Payer: Medicare HMO | Admitting: Cardiovascular Disease

## 2022-11-04 VITALS — BP 120/61 | HR 110 | Ht 62.0 in | Wt 167.2 lb

## 2022-11-04 DIAGNOSIS — I2510 Atherosclerotic heart disease of native coronary artery without angina pectoris: Secondary | ICD-10-CM | POA: Diagnosis not present

## 2022-11-04 DIAGNOSIS — I10 Essential (primary) hypertension: Secondary | ICD-10-CM | POA: Diagnosis not present

## 2022-11-04 DIAGNOSIS — E782 Mixed hyperlipidemia: Secondary | ICD-10-CM

## 2022-11-04 DIAGNOSIS — R000 Tachycardia, unspecified: Secondary | ICD-10-CM

## 2022-11-04 DIAGNOSIS — I340 Nonrheumatic mitral (valve) insufficiency: Secondary | ICD-10-CM | POA: Diagnosis not present

## 2022-11-04 MED ORDER — METOPROLOL SUCCINATE ER 100 MG PO TB24: 100.0000 mg | ORAL_TABLET | Freq: Every day | ORAL | 11 refills | Status: DC

## 2022-11-04 NOTE — Progress Notes (Signed)
Cardiology Office Note   Date:  11/04/2022   ID:  Alexa Coleman, DOB 30-Sep-1933, MRN [id number]  PCP:  Margaretann Loveless, MD  Cardiologist:  Adrian Blackwater, MD      History of Present Illness: Alexa Coleman is a 89 y.o. female who presents for  Chief Complaint  Patient presents with   Follow-up    3 mo F/U    HPI    Past Medical History:  Diagnosis Date   High cholesterol    Hypertension      Past Surgical History:  Procedure Laterality Date   ABDOMINAL HYSTERECTOMY       Current Outpatient Medications  Medication Sig Dispense Refill   metoprolol succinate (TOPROL XL) 100 MG 24 hr tablet Take 1 tablet (100 mg total) by mouth daily. Take with or immediately following a meal. 30 tablet 11   alendronate (FOSAMAX) 70 MG tablet Take 70 mg by mouth once a week. Take with a full glass of water on an empty stomach.     atorvastatin (LIPITOR) 40 MG tablet TAKE 1 TABLET EVERY DAY 90 tablet 3   clopidogrel (PLAVIX) 75 MG tablet TAKE 1 TABLET EVERY DAY 90 tablet 3   losartan (COZAAR) 100 MG tablet TAKE 1 TABLET EVERY DAY 90 tablet 3   Vitamin D, Ergocalciferol, (DRISDOL) 1.25 MG (50000 UNIT) CAPS capsule TAKE 1 CAPSULE ONE TIME WEEKLY 12 capsule 3   No current facility-administered medications for this visit.    Allergies:   Patient has no known allergies.    Social History:   reports that she has never smoked. She has never used smokeless tobacco. She reports that she does not drink alcohol and does not use drugs.   Family History:  family history is not on file.    ROS:     ROS    All other systems are reviewed and negative.    PHYSICAL EXAM: VS:  BP 120/61   Pulse (!) 110   Ht 5\' 2"  (1.575 m)   Wt 167 lb 3.2 oz (75.8 kg)   SpO2 94%   BMI 30.58 kg/m  , BMI Body mass index is 30.58 kg/m. Last weight:  Wt Readings from Last 3 Encounters:  11/04/22 167 lb 3.2 oz (75.8 kg)  10/31/22 166 lb 3.2 oz (75.4 kg)  08/05/22 168 lb 3.2 oz (76.3 kg)     Physical  Exam    EKG:   Recent Labs: 10/25/2022: ALT 19; BUN 23; Creatinine, Ser 1.26; Hemoglobin 13.5; Platelets 242; Potassium 5.3; Sodium 138    Lipid Panel    Component Value Date/Time   CHOL 140 10/25/2022 1215   TRIG 182 (H) 10/25/2022 1215   HDL 39 (L) 10/25/2022 1215   CHOLHDL 3.6 10/25/2022 1215   LDLCALC 70 10/25/2022 1215      Other studies Reviewed: Additional studies/ records that were reviewed today include:  Review of the above records demonstrates:       No data to display            ASSESSMENT AND PLAN:    ICD-10-CM   1. Coronary artery disease involving native coronary artery of native heart without angina pectoris  I25.10 metoprolol succinate (TOPROL XL) 100 MG 24 hr tablet   stable no chest pains.    2. Nonrheumatic mitral valve regurgitation  I34.0 metoprolol succinate (TOPROL XL) 100 MG 24 hr tablet    3. Primary hypertension  I10 metoprolol succinate (TOPROL XL) 100 MG 24  hr tablet    4. Mixed hyperlipidemia  E78.2 metoprolol succinate (TOPROL XL) 100 MG 24 hr tablet    5. Sinus tachycardia seen on cardiac monitor  R00.0 metoprolol succinate (TOPROL XL) 100 MG 24 hr tablet   Will increase metoprolol to 100 mg daily       Problem List Items Addressed This Visit       Cardiovascular and Mediastinum   Primary hypertension   Relevant Medications   metoprolol succinate (TOPROL XL) 100 MG 24 hr tablet   Coronary artery disease involving native coronary artery of native heart without angina pectoris - Primary   Relevant Medications   metoprolol succinate (TOPROL XL) 100 MG 24 hr tablet   Nonrheumatic mitral valve regurgitation   Relevant Medications   metoprolol succinate (TOPROL XL) 100 MG 24 hr tablet     Other   Mixed hyperlipidemia   Relevant Medications   metoprolol succinate (TOPROL XL) 100 MG 24 hr tablet   Other Visit Diagnoses     Sinus tachycardia seen on cardiac monitor       Will increase metoprolol to 100 mg daily   Relevant  Medications   metoprolol succinate (TOPROL XL) 100 MG 24 hr tablet          Disposition:   Return in about 3 months (around 02/04/2023).    Total time spent: 30 minutes  Signed,  Adrian Blackwater, MD  11/04/2022 3:03 PM    [facility]

## 2023-01-03 ENCOUNTER — Other Ambulatory Visit: Payer: Self-pay | Admitting: Internal Medicine

## 2023-01-24 IMAGING — MG MM DIGITAL SCREENING BILAT W/ TOMO AND CAD
8 series · 8 of 24 positions shown · non-contrast
Comparison: Previous exam(s).

CLINICAL DATA: Screening.

EXAM:
DIGITAL SCREENING BILATERAL MAMMOGRAM WITH TOMOSYNTHESIS AND CAD
TECHNIQUE: Bilateral screening digital craniocaudal and mediolateral oblique
mammograms were obtained. Bilateral screening digital breast
tomosynthesis was performed. The images were evaluated with
computer-aided detection.

[L MLO synth-2D]
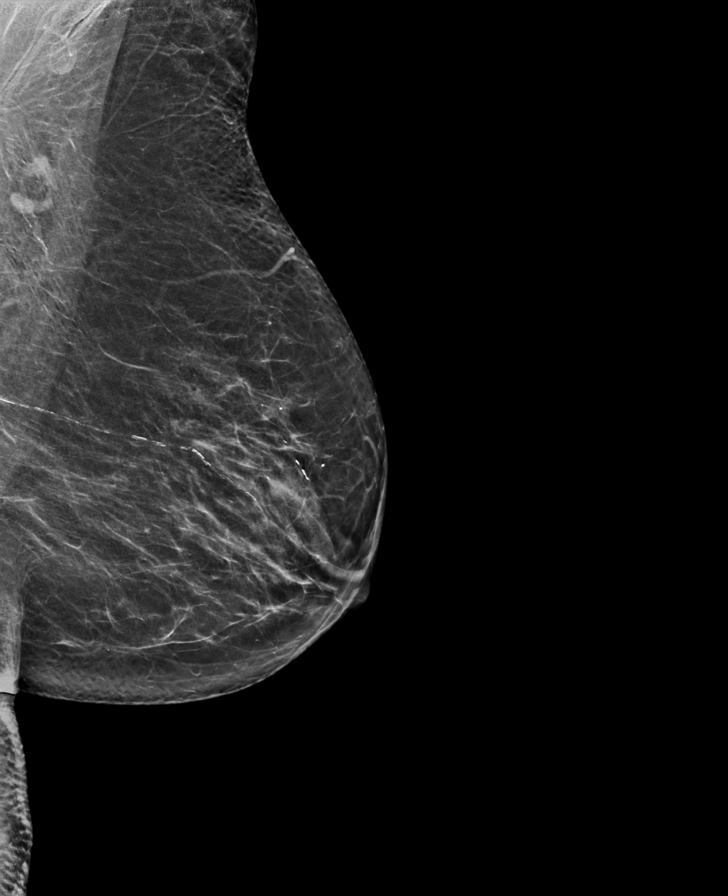

[R CC synth-2D]
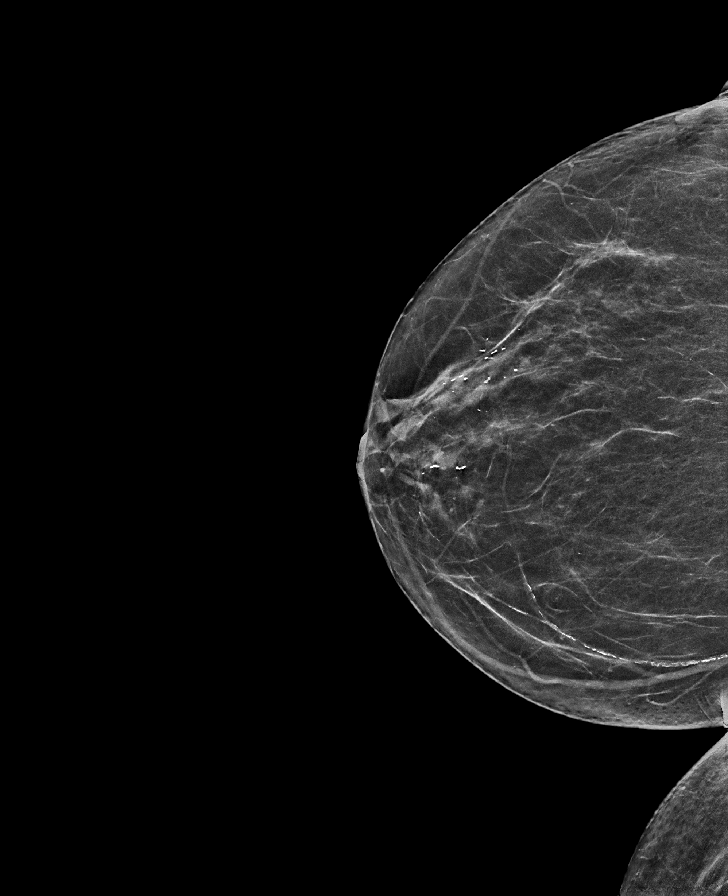

[R MLO synth-2D]
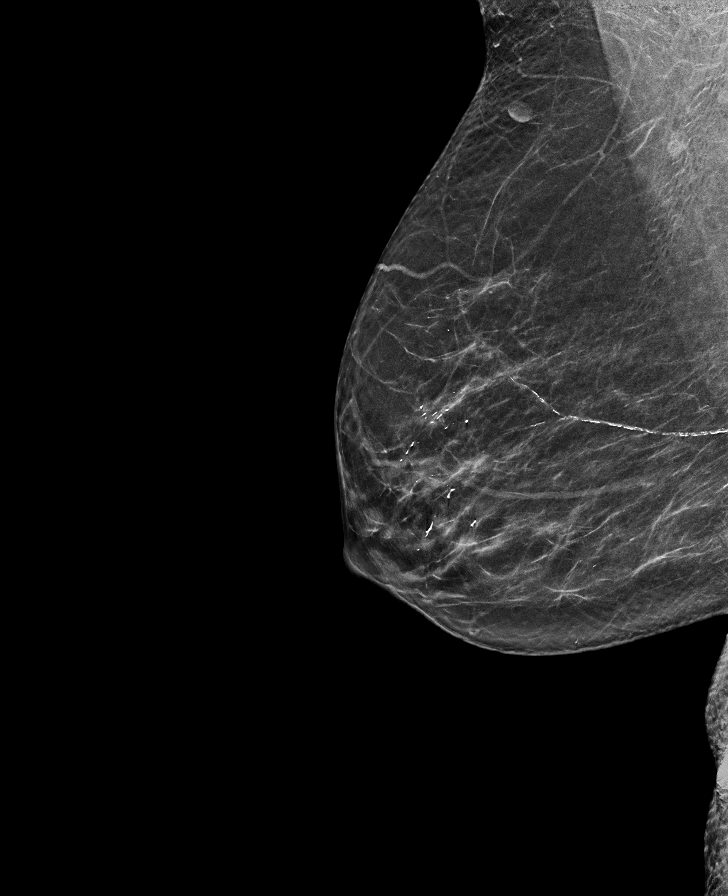

[L CC synth-2D]
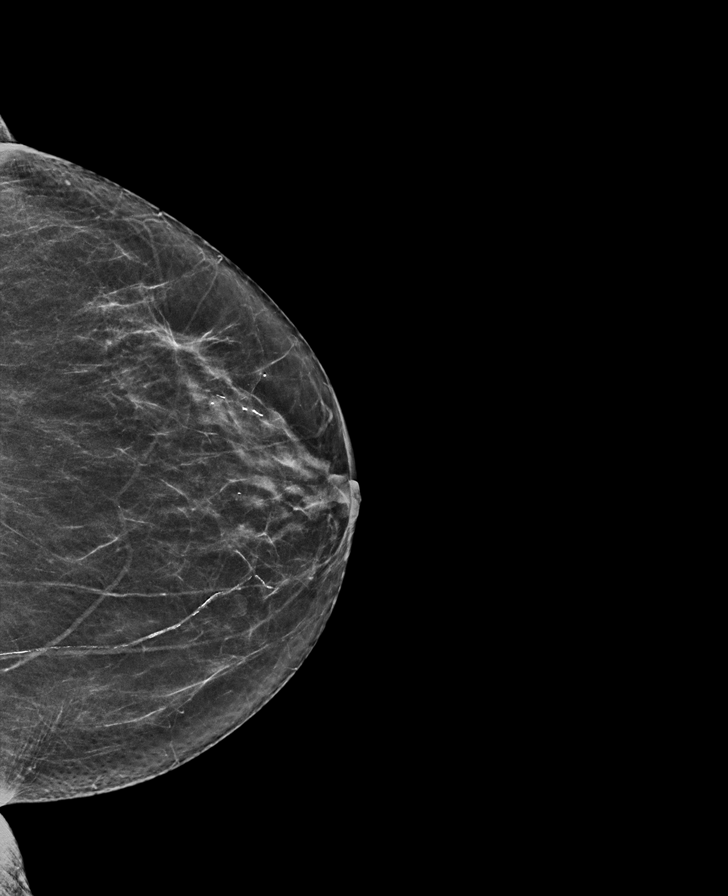

[R MLO tomo · tomo slice 31/62.0]
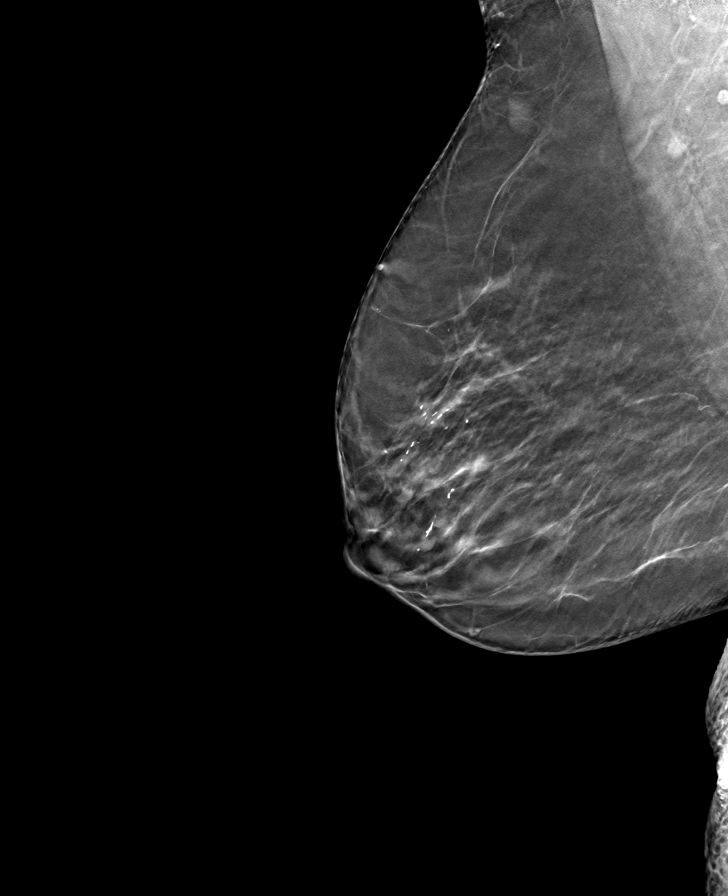

[L MLO tomo · tomo slice 32/63.0]
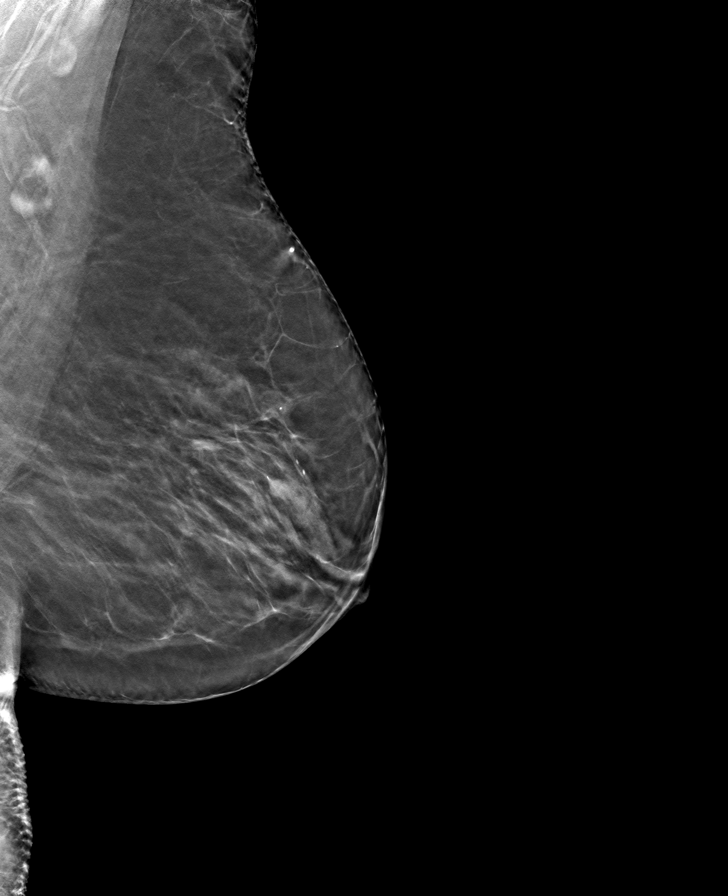

[L CC tomo · tomo slice 29/58.0]
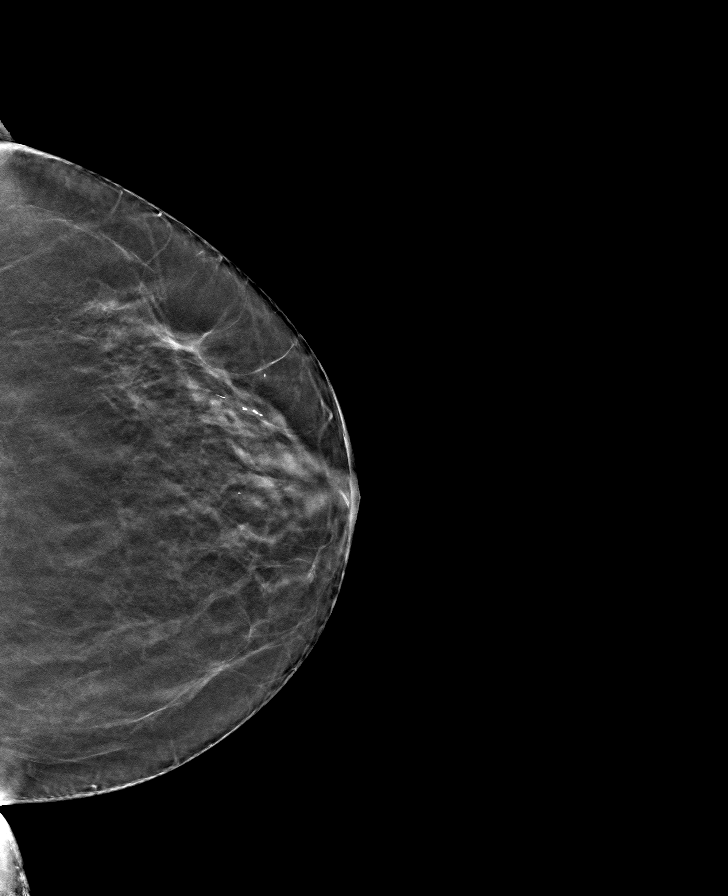

[R CC tomo · tomo slice 29/58.0]
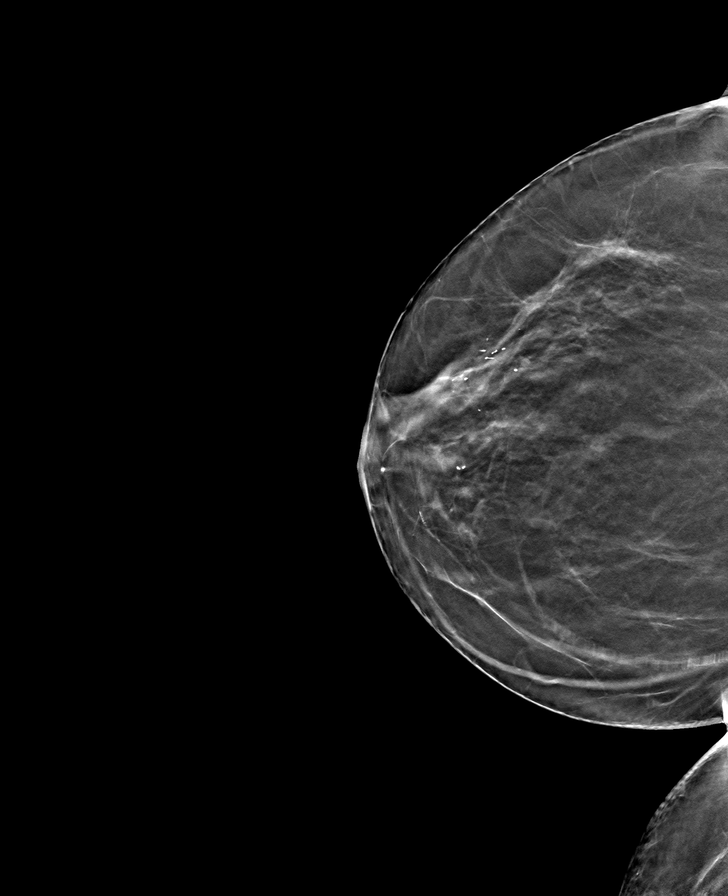

[8 of 24 positions shown; findings below may reference images not displayed]

ACR Breast Density Category b: There are scattered areas of
fibroglandular density.
FINDINGS: There are no findings suspicious for malignancy.
IMPRESSION: No mammographic evidence of malignancy. A result letter of this
screening mammogram will be mailed directly to the patient.

RECOMMENDATION:
Screening mammogram in one year. (Code:51-O-LD2)

BI-RADS CATEGORY  1: Negative.

## 2023-02-03 ENCOUNTER — Encounter: Payer: Self-pay | Admitting: Cardiovascular Disease

## 2023-02-03 ENCOUNTER — Ambulatory Visit: Payer: Medicare HMO | Admitting: Cardiovascular Disease

## 2023-02-03 VITALS — BP 140/85 | HR 72 | Ht 62.0 in | Wt 166.8 lb

## 2023-02-03 DIAGNOSIS — I2510 Atherosclerotic heart disease of native coronary artery without angina pectoris: Secondary | ICD-10-CM

## 2023-02-03 DIAGNOSIS — I340 Nonrheumatic mitral (valve) insufficiency: Secondary | ICD-10-CM

## 2023-02-03 DIAGNOSIS — R000 Tachycardia, unspecified: Secondary | ICD-10-CM

## 2023-02-03 DIAGNOSIS — E782 Mixed hyperlipidemia: Secondary | ICD-10-CM

## 2023-02-03 DIAGNOSIS — I10 Essential (primary) hypertension: Secondary | ICD-10-CM

## 2023-02-03 MED ORDER — LOSARTAN POTASSIUM-HCTZ 100-12.5 MG PO TABS: 1.0000 | ORAL_TABLET | Freq: Every day | ORAL | 2 refills | Status: DC

## 2023-02-03 NOTE — Progress Notes (Signed)
Cardiology Office Note   Date:  02/03/2023   ID:  Alexa Coleman, DOB 1934/02/26, MRN [id number]  PCP:  Margaretann Loveless, MD  Cardiologist:  Adrian Blackwater, MD      History of Present Illness: Alexa Coleman is a 89 y.o. female who presents for  Chief Complaint  Patient presents with   Follow-up    3 mo    Doing well      Past Medical History:  Diagnosis Date   High cholesterol    Hypertension      Past Surgical History:  Procedure Laterality Date   ABDOMINAL HYSTERECTOMY       Current Outpatient Medications  Medication Sig Dispense Refill   losartan-hydrochlorothiazide (HYZAAR) 100-12.5 MG tablet Take 1 tablet by mouth daily. 30 tablet 2   alendronate (FOSAMAX) 70 MG tablet TAKE 1 TABLET EVERY WEEK 12 tablet 3   atorvastatin (LIPITOR) 40 MG tablet TAKE 1 TABLET EVERY DAY 90 tablet 3   clopidogrel (PLAVIX) 75 MG tablet TAKE 1 TABLET EVERY DAY 90 tablet 3   metoprolol succinate (TOPROL XL) 100 MG 24 hr tablet Take 1 tablet (100 mg total) by mouth daily. Take with or immediately following a meal. 30 tablet 11   Vitamin D, Ergocalciferol, (DRISDOL) 1.25 MG (50000 UNIT) CAPS capsule TAKE 1 CAPSULE ONE TIME WEEKLY 12 capsule 3   No current facility-administered medications for this visit.    Allergies:   Patient has no known allergies.    Social History:   reports that she has never smoked. She has never used smokeless tobacco. She reports that she does not drink alcohol and does not use drugs.   Family History:  family history is not on file.    ROS:     Review of Systems  Constitutional: Negative.   HENT: Negative.    Eyes: Negative.   Respiratory: Negative.    Gastrointestinal: Negative.   Genitourinary: Negative.   Musculoskeletal: Negative.   Skin: Negative.   Neurological: Negative.   Endo/Heme/Allergies: Negative.   Psychiatric/Behavioral: Negative.    All other systems reviewed and are negative.     All other systems are reviewed and  negative.    PHYSICAL EXAM: VS:  BP (!) 140/85   Pulse 72   Ht 5\' 2"  (1.575 m)   Wt 166 lb 12.8 oz (75.7 kg)   SpO2 96%   BMI 30.51 kg/m  , BMI Body mass index is 30.51 kg/m. Last weight:  Wt Readings from Last 3 Encounters:  02/03/23 166 lb 12.8 oz (75.7 kg)  11/04/22 167 lb 3.2 oz (75.8 kg)  10/31/22 166 lb 3.2 oz (75.4 kg)     Physical Exam Constitutional:      Appearance: Normal appearance.  Cardiovascular:     Rate and Rhythm: Normal rate and regular rhythm.     Heart sounds: Normal heart sounds.  Pulmonary:     Effort: Pulmonary effort is normal.     Breath sounds: Normal breath sounds.  Musculoskeletal:     Right lower leg: No edema.     Left lower leg: No edema.  Neurological:     Mental Status: She is alert.       EKG:   Recent Labs: 10/25/2022: ALT 19; BUN 23; Creatinine, Ser 1.26; Hemoglobin 13.5; Platelets 242; Potassium 5.3; Sodium 138    Lipid Panel    Component Value Date/Time   CHOL 140 10/25/2022 1215   TRIG 182 (H) 10/25/2022 1215   HDL  39 (L) 10/25/2022 1215   CHOLHDL 3.6 10/25/2022 1215   LDLCALC 70 10/25/2022 1215      Other studies Reviewed: Additional studies/ records that were reviewed today include:  Review of the above records demonstrates:       No data to display            ASSESSMENT AND PLAN:    ICD-10-CM   1. Coronary artery disease involving native coronary artery of native heart without angina pectoris  I25.10 losartan-hydrochlorothiazide (HYZAAR) 100-12.5 MG tablet    2. Nonrheumatic mitral valve regurgitation  I34.0 losartan-hydrochlorothiazide (HYZAAR) 100-12.5 MG tablet    3. Primary hypertension  I10 losartan-hydrochlorothiazide (HYZAAR) 100-12.5 MG tablet   Repeat BP 160/80, change losartan to hyzaar 100/12.5    4. Mixed hyperlipidemia  E78.2 losartan-hydrochlorothiazide (HYZAAR) 100-12.5 MG tablet    5. Sinus tachycardia seen on cardiac monitor  R00.0 losartan-hydrochlorothiazide (HYZAAR) 100-12.5 MG  tablet   HR is better today       Problem List Items Addressed This Visit       Cardiovascular and Mediastinum   Primary hypertension   Relevant Medications   losartan-hydrochlorothiazide (HYZAAR) 100-12.5 MG tablet   Coronary artery disease involving native coronary artery of native heart without angina pectoris - Primary   Relevant Medications   losartan-hydrochlorothiazide (HYZAAR) 100-12.5 MG tablet   Nonrheumatic mitral valve regurgitation   Relevant Medications   losartan-hydrochlorothiazide (HYZAAR) 100-12.5 MG tablet     Other   Mixed hyperlipidemia   Relevant Medications   losartan-hydrochlorothiazide (HYZAAR) 100-12.5 MG tablet   Other Visit Diagnoses     Sinus tachycardia seen on cardiac monitor       HR is better today   Relevant Medications   losartan-hydrochlorothiazide (HYZAAR) 100-12.5 MG tablet          Disposition:   Return in about 2 months (around 04/05/2023).    Total time spent: 30 minutes  Signed,  Adrian Blackwater, MD  02/03/2023 1:26 PM    [facility]

## 2023-03-01 ENCOUNTER — Ambulatory Visit
Admission: RE | Admit: 2023-03-01 | Discharge: 2023-03-01 | Disposition: A | Discharge status: Home / Self Care | Payer: Medicare HMO | Source: Ambulatory Visit | Attending: Internal Medicine | Admitting: Internal Medicine

## 2023-03-01 DIAGNOSIS — Z1231 Encounter for screening mammogram for malignant neoplasm of breast: Secondary | ICD-10-CM | POA: Diagnosis not present

## 2023-03-22 ENCOUNTER — Other Ambulatory Visit: Payer: Self-pay | Admitting: Internal Medicine

## 2023-03-23 DIAGNOSIS — Z0100 Encounter for examination of eyes and vision without abnormal findings: Secondary | ICD-10-CM | POA: Diagnosis not present

## 2023-03-23 DIAGNOSIS — H40033 Anatomical narrow angle, bilateral: Secondary | ICD-10-CM | POA: Diagnosis not present

## 2023-03-23 DIAGNOSIS — H11002 Unspecified pterygium of left eye: Secondary | ICD-10-CM | POA: Diagnosis not present

## 2023-03-23 DIAGNOSIS — H02103 Unspecified ectropion of right eye, unspecified eyelid: Secondary | ICD-10-CM | POA: Diagnosis not present

## 2023-03-23 DIAGNOSIS — H2513 Age-related nuclear cataract, bilateral: Secondary | ICD-10-CM | POA: Diagnosis not present

## 2023-03-31 ENCOUNTER — Other Ambulatory Visit: Payer: Self-pay

## 2023-03-31 DIAGNOSIS — I10 Essential (primary) hypertension: Secondary | ICD-10-CM

## 2023-03-31 DIAGNOSIS — R000 Tachycardia, unspecified: Secondary | ICD-10-CM

## 2023-03-31 DIAGNOSIS — I340 Nonrheumatic mitral (valve) insufficiency: Secondary | ICD-10-CM

## 2023-03-31 DIAGNOSIS — I2510 Atherosclerotic heart disease of native coronary artery without angina pectoris: Secondary | ICD-10-CM

## 2023-03-31 DIAGNOSIS — E782 Mixed hyperlipidemia: Secondary | ICD-10-CM

## 2023-04-05 DIAGNOSIS — H2512 Age-related nuclear cataract, left eye: Secondary | ICD-10-CM | POA: Diagnosis not present

## 2023-04-05 DIAGNOSIS — H11002 Unspecified pterygium of left eye: Secondary | ICD-10-CM | POA: Diagnosis not present

## 2023-04-06 ENCOUNTER — Ambulatory Visit: Payer: Medicare HMO | Admitting: Cardiovascular Disease

## 2023-04-06 ENCOUNTER — Encounter: Payer: Self-pay | Admitting: Cardiovascular Disease

## 2023-04-06 VITALS — BP 140/80 | HR 65 | Ht 62.0 in | Wt 166.0 lb

## 2023-04-06 DIAGNOSIS — I10 Essential (primary) hypertension: Secondary | ICD-10-CM | POA: Diagnosis not present

## 2023-04-06 DIAGNOSIS — E782 Mixed hyperlipidemia: Secondary | ICD-10-CM

## 2023-04-06 DIAGNOSIS — I2510 Atherosclerotic heart disease of native coronary artery without angina pectoris: Secondary | ICD-10-CM | POA: Diagnosis not present

## 2023-04-06 DIAGNOSIS — I340 Nonrheumatic mitral (valve) insufficiency: Secondary | ICD-10-CM

## 2023-04-06 MED ORDER — AMLODIPINE BESYLATE 5 MG PO TABS: 5.0000 mg | ORAL_TABLET | Freq: Every day | ORAL | 11 refills | Status: DC

## 2023-04-06 NOTE — Progress Notes (Signed)
Cardiology Office Note   Date:  04/06/2023   ID:  Alexa Coleman, DOB 06/21/1933, MRN [id number]  PCP:  Margaretann Loveless, MD  Cardiologist:  Adrian Blackwater, MD      History of Present Illness: Alexa Coleman is a 89 y.o. female who presents for  Chief Complaint  Patient presents with   Follow-up    2 month follow up    Has to get up at night to urinate after losatan changed to hyzaar.   But BP still elevated      Past Medical History:  Diagnosis Date   High cholesterol    Hypertension      Past Surgical History:  Procedure Laterality Date   ABDOMINAL HYSTERECTOMY       Current Outpatient Medications  Medication Sig Dispense Refill   alendronate (FOSAMAX) 70 MG tablet TAKE 1 TABLET EVERY WEEK 12 tablet 3   amLODipine (NORVASC) 5 MG tablet Take 1 tablet (5 mg total) by mouth daily. 30 tablet 11   atorvastatin (LIPITOR) 40 MG tablet TAKE 1 TABLET EVERY DAY 90 tablet 3   clopidogrel (PLAVIX) 75 MG tablet TAKE 1 TABLET EVERY DAY 90 tablet 3   losartan-hydrochlorothiazide (HYZAAR) 100-12.5 MG tablet Take 1 tablet by mouth daily. 30 tablet 2   metoprolol succinate (TOPROL XL) 100 MG 24 hr tablet Take 1 tablet (100 mg total) by mouth daily. Take with or immediately following a meal. 30 tablet 11   Vitamin D, Ergocalciferol, (DRISDOL) 1.25 MG (50000 UNIT) CAPS capsule TAKE 1 CAPSULE ONE TIME WEEKLY 12 capsule 3   No current facility-administered medications for this visit.    Allergies:   Patient has no known allergies.    Social History:   reports that she has never smoked. She has never used smokeless tobacco. She reports that she does not drink alcohol and does not use drugs.   Family History:  family history is not on file.    ROS:     Review of Systems  Constitutional: Negative.   HENT: Negative.    Eyes: Negative.   Respiratory: Negative.    Gastrointestinal: Negative.   Genitourinary: Negative.   Musculoskeletal: Negative.   Skin: Negative.    Neurological: Negative.   Endo/Heme/Allergies: Negative.   Psychiatric/Behavioral: Negative.    All other systems reviewed and are negative.     All other systems are reviewed and negative.    PHYSICAL EXAM: VS:  BP (!) 140/80   Pulse 65   Ht 5\' 2"  (1.575 m)   Wt 166 lb (75.3 kg)   SpO2 97%   BMI 30.36 kg/m  , BMI Body mass index is 30.36 kg/m. Last weight:  Wt Readings from Last 3 Encounters:  04/06/23 166 lb (75.3 kg)  02/03/23 166 lb 12.8 oz (75.7 kg)  11/04/22 167 lb 3.2 oz (75.8 kg)     Physical Exam Constitutional:      Appearance: Normal appearance.  Cardiovascular:     Rate and Rhythm: Normal rate and regular rhythm.     Heart sounds: Normal heart sounds.  Pulmonary:     Effort: Pulmonary effort is normal.     Breath sounds: Normal breath sounds.  Musculoskeletal:     Right lower leg: No edema.     Left lower leg: No edema.  Neurological:     Mental Status: She is alert.       EKG:   Recent Labs: 10/25/2022: ALT 19; BUN 23; Creatinine, Ser 1.26; Hemoglobin  13.5; Platelets 242; Potassium 5.3; Sodium 138    Lipid Panel    Component Value Date/Time   CHOL 140 10/25/2022 1215   TRIG 182 (H) 10/25/2022 1215   HDL 39 (L) 10/25/2022 1215   CHOLHDL 3.6 10/25/2022 1215   LDLCALC 70 10/25/2022 1215      Other studies Reviewed: Additional studies/ records that were reviewed today include:  Review of the above records demonstrates:       No data to display            ASSESSMENT AND PLAN:    ICD-10-CM   1. Primary hypertension  I10 amLODipine (NORVASC) 5 MG tablet    PCV ECHOCARDIOGRAM COMPLETE   Repeat BP 150/85, add amlodapine 5 mg    2. Coronary artery disease involving native coronary artery of native heart without angina pectoris  I25.10 amLODipine (NORVASC) 5 MG tablet    PCV ECHOCARDIOGRAM COMPLETE    3. Nonrheumatic mitral valve regurgitation  I34.0 amLODipine (NORVASC) 5 MG tablet    PCV ECHOCARDIOGRAM COMPLETE    4. Mixed  hyperlipidemia  E78.2 amLODipine (NORVASC) 5 MG tablet    PCV ECHOCARDIOGRAM COMPLETE       Problem List Items Addressed This Visit       Cardiovascular and Mediastinum   Primary hypertension - Primary   Relevant Medications   amLODipine (NORVASC) 5 MG tablet   Other Relevant Orders   PCV ECHOCARDIOGRAM COMPLETE   Coronary artery disease involving native coronary artery of native heart without angina pectoris   Relevant Medications   amLODipine (NORVASC) 5 MG tablet   Other Relevant Orders   PCV ECHOCARDIOGRAM COMPLETE   Nonrheumatic mitral valve regurgitation   Relevant Medications   amLODipine (NORVASC) 5 MG tablet   Other Relevant Orders   PCV ECHOCARDIOGRAM COMPLETE     Other   Mixed hyperlipidemia   Relevant Medications   amLODipine (NORVASC) 5 MG tablet   Other Relevant Orders   PCV ECHOCARDIOGRAM COMPLETE       Disposition:   Return in about 4 weeks (around 05/04/2023) for echo and f/u.    Total time spent: 30 minutes  Signed,  Adrian Blackwater, MD  04/06/2023 1:43 PM    [facility]

## 2023-05-04 ENCOUNTER — Ambulatory Visit (INDEPENDENT_AMBULATORY_CARE_PROVIDER_SITE_OTHER): Payer: Medicare HMO | Admitting: Internal Medicine

## 2023-05-04 ENCOUNTER — Encounter: Payer: Self-pay | Admitting: Internal Medicine

## 2023-05-04 VITALS — BP 108/74 | HR 75 | Ht 62.0 in | Wt 165.0 lb

## 2023-05-04 DIAGNOSIS — E782 Mixed hyperlipidemia: Secondary | ICD-10-CM

## 2023-05-04 DIAGNOSIS — I10 Essential (primary) hypertension: Secondary | ICD-10-CM | POA: Diagnosis not present

## 2023-05-04 DIAGNOSIS — I2510 Atherosclerotic heart disease of native coronary artery without angina pectoris: Secondary | ICD-10-CM

## 2023-05-04 DIAGNOSIS — R7303 Prediabetes: Secondary | ICD-10-CM | POA: Diagnosis not present

## 2023-05-04 NOTE — Progress Notes (Signed)
Established Patient Office Visit  Subjective:  Patient ID: Alexa Coleman, female    DOB: 1933/12/20  Age: 89 y.o. MRN: [id number]  Chief Complaint  Patient presents with   Follow-up    6 month follow up    Patient is here for her follow-up.  She is feeling well and has no new complaints.  She is getting ready for cataract surgery.  Her blood pressure is slightly on the low side, but recently her meds were adjusted by the cardiologist.  She has an appointment next week for further adjustment.  She denies any headaches or dizziness, no chest pain and no shortness of breath.    No other concerns at this time.   Past Medical History:  Diagnosis Date   High cholesterol    Hypertension     Past Surgical History:  Procedure Laterality Date   ABDOMINAL HYSTERECTOMY      Social History   Socioeconomic History   Marital status: Married    Spouse name: Not on file   Number of children: Not on file   Years of education: Not on file   Highest education level: Not on file  Occupational History   Not on file  Tobacco Use   Smoking status: Never   Smokeless tobacco: Never  Vaping Use   Vaping status: Never Used  Substance and Sexual Activity   Alcohol use: Never   Drug use: Never   Sexual activity: Not on file  Other Topics Concern   Not on file  Social History Narrative   Not on file   Social Drivers of Health   Financial Resource Strain: Not on file  Food Insecurity: Not on file  Transportation Needs: Not on file  Physical Activity: Not on file  Stress: Not on file  Social Connections: Not on file  Intimate Partner Violence: Not on file    Family History  Problem Relation Age of Onset   Breast cancer Neg Hx     No Known Allergies  Outpatient Medications Prior to Visit  Medication Sig   alendronate (FOSAMAX) 70 MG tablet TAKE 1 TABLET EVERY WEEK   amLODipine (NORVASC) 5 MG tablet Take 1 tablet (5 mg total) by mouth daily.   atorvastatin (LIPITOR) 40 MG  tablet TAKE 1 TABLET EVERY DAY   clopidogrel (PLAVIX) 75 MG tablet TAKE 1 TABLET EVERY DAY   losartan-hydrochlorothiazide (HYZAAR) 100-12.5 MG tablet Take 1 tablet by mouth daily.   metoprolol succinate (TOPROL XL) 100 MG 24 hr tablet Take 1 tablet (100 mg total) by mouth daily. Take with or immediately following a meal.   Vitamin D, Ergocalciferol, (DRISDOL) 1.25 MG (50000 UNIT) CAPS capsule TAKE 1 CAPSULE ONE TIME WEEKLY   No facility-administered medications prior to visit.    Review of Systems  Constitutional: Negative.  Negative for chills, fever, malaise/fatigue and weight loss.  HENT: Negative.  Negative for congestion, ear discharge, nosebleeds, sinus pain and tinnitus.   Eyes: Negative.   Respiratory: Negative.  Negative for cough, shortness of breath and stridor.   Cardiovascular: Negative.  Negative for chest pain, palpitations and leg swelling.  Gastrointestinal: Negative.  Negative for abdominal pain, constipation, diarrhea, heartburn, nausea and vomiting.  Genitourinary: Negative.  Negative for dysuria and flank pain.  Musculoskeletal: Negative.  Negative for joint pain and myalgias.  Skin: Negative.   Neurological: Negative.  Negative for dizziness, tingling, tremors, speech change and headaches.  Endo/Heme/Allergies: Negative.   Psychiatric/Behavioral: Negative.  Negative for depression and suicidal ideas.  The patient is not nervous/anxious.        Objective:   BP 108/74   Pulse 75   Ht 5\' 2"  (1.575 m)   Wt 165 lb (74.8 kg)   SpO2 96%   BMI 30.18 kg/m   Vitals:   05/04/23 1304  BP: 108/74  Pulse: 75  Height: 5\' 2"  (1.575 m)  Weight: 165 lb (74.8 kg)  SpO2: 96%  BMI (Calculated): 30.17    Physical Exam Vitals and nursing note reviewed.  Constitutional:      Appearance: Normal appearance.  HENT:     Head: Normocephalic and atraumatic.     Nose: Nose normal.     Mouth/Throat:     Mouth: Mucous membranes are moist.     Pharynx: Oropharynx is clear.   Eyes:     Conjunctiva/sclera: Conjunctivae normal.     Pupils: Pupils are equal, round, and reactive to light.  Cardiovascular:     Rate and Rhythm: Normal rate and regular rhythm.     Pulses: Normal pulses.     Heart sounds: Normal heart sounds. No murmur heard. Pulmonary:     Effort: Pulmonary effort is normal.     Breath sounds: Normal breath sounds. No wheezing.  Abdominal:     General: Bowel sounds are normal.     Palpations: Abdomen is soft.     Tenderness: There is no abdominal tenderness. There is no right CVA tenderness or left CVA tenderness.  Musculoskeletal:        General: Normal range of motion.     Cervical back: Normal range of motion.     Right lower leg: No edema.     Left lower leg: No edema.  Skin:    General: Skin is warm and dry.  Neurological:     General: No focal deficit present.     Mental Status: She is alert and oriented to person, place, and time.  Psychiatric:        Mood and Affect: Mood normal.        Behavior: Behavior normal.      No results found for any visits on 05/04/23.  No results found for this or any previous visit (from the past 2160 hours).    Assessment & Plan:  Patient advised to continue all her medications.  Monitor blood pressure. Fasting labs. Problem List Items Addressed This Visit     Mixed hyperlipidemia   Relevant Orders   CMP14+EGFR   Lipid Panel w/o Chol/HDL Ratio   Prediabetes   Relevant Orders   Hemoglobin A1c   Primary hypertension - Primary   Relevant Orders   CMP14+EGFR   Coronary artery disease involving native coronary artery of native heart without angina pectoris    Return in about 6 months (around 11/01/2023).   Total time spent: 30 minutes  Margaretann Loveless, MD  05/04/2023   This document may have been prepared by [facility] Voice Recognition software and as such may include unintentional dictation errors.

## 2023-05-08 ENCOUNTER — Ambulatory Visit: Payer: Medicare HMO

## 2023-05-08 DIAGNOSIS — I2510 Atherosclerotic heart disease of native coronary artery without angina pectoris: Secondary | ICD-10-CM

## 2023-05-08 DIAGNOSIS — I340 Nonrheumatic mitral (valve) insufficiency: Secondary | ICD-10-CM | POA: Diagnosis not present

## 2023-05-08 DIAGNOSIS — I361 Nonrheumatic tricuspid (valve) insufficiency: Secondary | ICD-10-CM

## 2023-05-08 DIAGNOSIS — E782 Mixed hyperlipidemia: Secondary | ICD-10-CM

## 2023-05-08 DIAGNOSIS — I10 Essential (primary) hypertension: Secondary | ICD-10-CM

## 2023-05-10 ENCOUNTER — Encounter: Payer: Self-pay | Admitting: Ophthalmology

## 2023-05-12 ENCOUNTER — Encounter: Payer: Self-pay | Admitting: Cardiovascular Disease

## 2023-05-12 ENCOUNTER — Ambulatory Visit: Payer: Medicare HMO | Admitting: Cardiovascular Disease

## 2023-05-12 ENCOUNTER — Ambulatory Visit (INDEPENDENT_AMBULATORY_CARE_PROVIDER_SITE_OTHER): Payer: Medicare HMO | Admitting: Cardiovascular Disease

## 2023-05-12 VITALS — BP 128/78 | HR 67 | Ht 62.0 in | Wt 162.0 lb

## 2023-05-12 DIAGNOSIS — E782 Mixed hyperlipidemia: Secondary | ICD-10-CM | POA: Diagnosis not present

## 2023-05-12 DIAGNOSIS — I340 Nonrheumatic mitral (valve) insufficiency: Secondary | ICD-10-CM

## 2023-05-12 DIAGNOSIS — I10 Essential (primary) hypertension: Secondary | ICD-10-CM

## 2023-05-12 DIAGNOSIS — I2510 Atherosclerotic heart disease of native coronary artery without angina pectoris: Secondary | ICD-10-CM | POA: Diagnosis not present

## 2023-05-12 MED ORDER — LOSARTAN POTASSIUM 100 MG PO TABS: 100.0000 mg | ORAL_TABLET | Freq: Every day | ORAL | 11 refills | Status: DC

## 2023-05-12 NOTE — Progress Notes (Signed)
Cardiology Office Note   Date:  05/12/2023   ID:  Alexa Coleman, DOB [birth year], MRN [id number]  PCP:  Margaretann Loveless, MD  Cardiologist:  Adrian Blackwater, MD      History of Present Illness: Alexa Coleman is a 89 y.o. female who presents for  Chief Complaint  Patient presents with   Results    Echo results    Doing well      Past Medical History:  Diagnosis Date   High cholesterol    Hypertension      Past Surgical History:  Procedure Laterality Date   ABDOMINAL HYSTERECTOMY       Current Outpatient Medications  Medication Sig Dispense Refill   alendronate (FOSAMAX) 70 MG tablet TAKE 1 TABLET EVERY WEEK 12 tablet 3   amLODipine (NORVASC) 5 MG tablet Take 1 tablet (5 mg total) by mouth daily. 30 tablet 11   atorvastatin (LIPITOR) 40 MG tablet TAKE 1 TABLET EVERY DAY 90 tablet 3   clopidogrel (PLAVIX) 75 MG tablet TAKE 1 TABLET EVERY DAY 90 tablet 3   losartan (COZAAR) 100 MG tablet Take 1 tablet (100 mg total) by mouth daily. 30 tablet 11   metoprolol succinate (TOPROL XL) 100 MG 24 hr tablet Take 1 tablet (100 mg total) by mouth daily. Take with or immediately following a meal. 30 tablet 11   Vitamin D, Ergocalciferol, (DRISDOL) 1.25 MG (50000 UNIT) CAPS capsule TAKE 1 CAPSULE ONE TIME WEEKLY 12 capsule 3   No current facility-administered medications for this visit.    Allergies:   Lisinopril and Other    Social History:   reports that she has never smoked. She has never used smokeless tobacco. She reports that she does not drink alcohol and does not use drugs.   Family History:  family history is not on file.    ROS:     Review of Systems  Constitutional: Negative.   HENT: Negative.    Eyes: Negative.   Respiratory: Negative.    Gastrointestinal: Negative.   Genitourinary: Negative.   Musculoskeletal: Negative.   Skin: Negative.   Neurological: Negative.   Endo/Heme/Allergies: Negative.   Psychiatric/Behavioral: Negative.    All other systems  reviewed and are negative.     All other systems are reviewed and negative.    PHYSICAL EXAM: VS:  BP 128/78   Pulse 67   Ht 5\' 2"  (1.575 m)   Wt 162 lb (73.5 kg)   SpO2 98%   BMI 29.63 kg/m  , BMI Body mass index is 29.63 kg/m. Last weight:  Wt Readings from Last 3 Encounters:  05/12/23 162 lb (73.5 kg)  05/04/23 165 lb (74.8 kg)  04/06/23 166 lb (75.3 kg)     Physical Exam Constitutional:      Appearance: Normal appearance.  Cardiovascular:     Rate and Rhythm: Normal rate and regular rhythm.     Heart sounds: Normal heart sounds.  Pulmonary:     Effort: Pulmonary effort is normal.     Breath sounds: Normal breath sounds.  Musculoskeletal:     Right lower leg: No edema.     Left lower leg: No edema.  Neurological:     Mental Status: She is alert.       EKG:   Recent Labs: 10/25/2022: ALT 19; BUN 23; Creatinine, Ser 1.26; Hemoglobin 13.5; Platelets 242; Potassium 5.3; Sodium 138    Lipid Panel    Component Value Date/Time   CHOL 140 10/25/2022  1215   TRIG 182 (H) 10/25/2022 1215   HDL 39 (L) 10/25/2022 1215   CHOLHDL 3.6 10/25/2022 1215   LDLCALC 70 10/25/2022 1215      Other studies Reviewed: Additional studies/ records that were reviewed today include:  Review of the above records demonstrates:       No data to display            ASSESSMENT AND PLAN:    ICD-10-CM   1. Primary hypertension  I10 losartan (COZAAR) 100 MG tablet   Has constipation with hyzaar, change to losartan 100 daily    2. Coronary artery disease involving native coronary artery of native heart without angina pectoris  I25.10 losartan (COZAAR) 100 MG tablet    3. Nonrheumatic mitral valve regurgitation  I34.0 losartan (COZAAR) 100 MG tablet   ECHO has grade 2 diastolic dysfunction but Frxiga not approved by insurance and has issues with diuretic causing constipation    4. Mixed hyperlipidemia  E78.2 losartan (COZAAR) 100 MG tablet       Problem List Items  Addressed This Visit       Cardiovascular and Mediastinum   Primary hypertension - Primary   Relevant Medications   losartan (COZAAR) 100 MG tablet   Coronary artery disease involving native coronary artery of native heart without angina pectoris   Relevant Medications   losartan (COZAAR) 100 MG tablet   Nonrheumatic mitral valve regurgitation   Relevant Medications   losartan (COZAAR) 100 MG tablet     Other   Mixed hyperlipidemia   Relevant Medications   losartan (COZAAR) 100 MG tablet       Disposition:   Return in about 3 months (around 08/10/2023).    Total time spent: 30 minutes  Signed,  Adrian Blackwater, MD  05/12/2023 2:45 PM    [facility]

## 2023-05-15 ENCOUNTER — Other Ambulatory Visit: Payer: Medicare HMO

## 2023-05-15 DIAGNOSIS — I10 Essential (primary) hypertension: Secondary | ICD-10-CM | POA: Diagnosis not present

## 2023-05-15 DIAGNOSIS — R7303 Prediabetes: Secondary | ICD-10-CM | POA: Diagnosis not present

## 2023-05-15 DIAGNOSIS — E782 Mixed hyperlipidemia: Secondary | ICD-10-CM | POA: Diagnosis not present

## 2023-05-16 LAB — CMP14+EGFR
ALT: 20 [IU]/L (ref 0–32)
AST: 17 [IU]/L (ref 0–40)
Albumin: 3.8 g/dL (ref 3.7–4.7)
Alkaline Phosphatase: 81 [IU]/L (ref 44–121)
BUN/Creatinine Ratio: 15 (ref 12–28)
BUN: 21 mg/dL (ref 8–27)
Bilirubin Total: 0.6 mg/dL (ref 0.0–1.2)
CO2: 24 mmol/L (ref 20–29)
Calcium: 8.6 mg/dL — ABNORMAL LOW (ref 8.7–10.3)
Chloride: 106 mmol/L (ref 96–106)
Creatinine, Ser: 1.42 mg/dL — ABNORMAL HIGH (ref 0.57–1.00)
Globulin, Total: 2.6 g/dL (ref 1.5–4.5)
Glucose: 103 mg/dL — ABNORMAL HIGH (ref 70–99)
Potassium: 5.1 mmol/L (ref 3.5–5.2)
Sodium: 140 mmol/L (ref 134–144)
Total Protein: 6.4 g/dL (ref 6.0–8.5)
eGFR: 36 mL/min/{1.73_m2} — ABNORMAL LOW (ref >59)

## 2023-05-16 LAB — LIPID PANEL W/O CHOL/HDL RATIO
Cholesterol, Total: 121 mg/dL (ref 100–199)
HDL: 40 mg/dL (ref >39)
LDL Chol Calc (NIH): 58 mg/dL (ref 0–99)
Triglycerides: 127 mg/dL (ref 0–149)
VLDL Cholesterol Cal: 23 mg/dL (ref 5–40)

## 2023-05-16 LAB — HEMOGLOBIN A1C
Est. average glucose Bld gHb Est-mCnc: 126 mg/dL
Hgb A1c MFr Bld: 6 % — ABNORMAL HIGH (ref 4.8–5.6)

## 2023-05-18 ENCOUNTER — Encounter: Payer: Self-pay | Admitting: Ophthalmology

## 2023-05-18 NOTE — Discharge Instructions (Signed)

## 2023-05-18 NOTE — Anesthesia Preprocedure Evaluation (Addendum)
Anesthesia Evaluation  Patient identified by MRN, date of birth, ID band Patient awake    Reviewed: Allergy & Precautions, H&P , NPO status , Patient's Chart, lab work & pertinent test results  Airway Mallampati: IV  TM Distance: <3 FB Neck ROM: Full   Comment: Barely 1 FB TMD, would expect very anterior airway, and likely difficult airway if intubation were needed.  Plan MAC with oxygen cannula today. Dental no notable dental hx.    Pulmonary neg pulmonary ROS   Pulmonary exam normal breath sounds clear to auscultation       Cardiovascular hypertension, + CAD  negative cardio ROS Normal cardiovascular exam Rhythm:Regular Rate:Normal     Neuro/Psych negative neurological ROS  negative psych ROS   GI/Hepatic negative GI ROS, Neg liver ROS,,,  Endo/Other  negative endocrine ROS    Renal/GU negative Renal ROS  negative genitourinary   Musculoskeletal negative musculoskeletal ROS (+)    Abdominal   Peds negative pediatric ROS (+)  Hematology negative hematology ROS (+)   Anesthesia Other Findings HTN CAD without angina, do not see echo nor heart cath, nor other documentation on Epic  Reproductive/Obstetrics negative OB ROS                              Anesthesia Physical Anesthesia Plan  ASA: 3  Anesthesia Plan: MAC   Post-op Pain Management:    Induction: Intravenous  PONV Risk Score and Plan:   Airway Management Planned: Natural Airway and Nasal Cannula  Additional Equipment:   Intra-op Plan:   Post-operative Plan:   Informed Consent: I have reviewed the patients History and Physical, chart, labs and discussed the procedure including the risks, benefits and alternatives for the proposed anesthesia with the patient or authorized representative who has indicated his/her understanding and acceptance.     Dental Advisory Given  Plan Discussed with: Anesthesiologist, CRNA  and Surgeon  Anesthesia Plan Comments: (Patient consented for risks of anesthesia including but not limited to:  - adverse reactions to medications - damage to eyes, teeth, lips or other oral mucosa - nerve damage due to positioning  - sore throat or hoarseness - Damage to heart, brain, nerves, lungs, other parts of body or loss of life  Patient voiced understanding and assent.)       Anesthesia Quick Evaluation

## 2023-05-22 ENCOUNTER — Encounter: Payer: Self-pay | Admitting: Ophthalmology

## 2023-05-22 ENCOUNTER — Other Ambulatory Visit: Payer: Self-pay

## 2023-05-22 ENCOUNTER — Ambulatory Visit
Admission: RE | Admit: 2023-05-22 | Discharge: 2023-05-22 | Disposition: A | Discharge status: Home / Self Care | Payer: Medicare HMO | Attending: Ophthalmology | Admitting: Ophthalmology

## 2023-05-22 ENCOUNTER — Encounter
Admission: RE | Disposition: A | Discharge status: Home / Self Care | Payer: Self-pay | Source: Home / Self Care | Attending: Ophthalmology

## 2023-05-22 ENCOUNTER — Ambulatory Visit: Payer: Medicare HMO | Admitting: Anesthesiology

## 2023-05-22 DIAGNOSIS — I340 Nonrheumatic mitral (valve) insufficiency: Secondary | ICD-10-CM | POA: Diagnosis not present

## 2023-05-22 DIAGNOSIS — H25012 Cortical age-related cataract, left eye: Secondary | ICD-10-CM | POA: Diagnosis not present

## 2023-05-22 DIAGNOSIS — H2512 Age-related nuclear cataract, left eye: Secondary | ICD-10-CM | POA: Diagnosis not present

## 2023-05-22 DIAGNOSIS — I2510 Atherosclerotic heart disease of native coronary artery without angina pectoris: Secondary | ICD-10-CM | POA: Insufficient documentation

## 2023-05-22 DIAGNOSIS — Z7902 Long term (current) use of antithrombotics/antiplatelets: Secondary | ICD-10-CM | POA: Diagnosis not present

## 2023-05-22 DIAGNOSIS — I10 Essential (primary) hypertension: Secondary | ICD-10-CM | POA: Insufficient documentation

## 2023-05-22 HISTORY — DX: Atherosclerotic heart disease of native coronary artery without angina pectoris: I25.10

## 2023-05-22 HISTORY — PX: CATARACT EXTRACTION W/PHACO: SHX586

## 2023-05-22 SURGERY — PHACOEMULSIFICATION, CATARACT, WITH IOL INSERTION
Anesthesia: Monitor Anesthesia Care | Site: Eye | Laterality: Left

## 2023-05-22 MED ORDER — SIGHTPATH DOSE#1 BSS IO SOLN
INTRAOCULAR | Status: DC | PRN
  Administered 2023-05-22: 90 mL via OPHTHALMIC

## 2023-05-22 MED ORDER — SIGHTPATH DOSE#1 NA HYALUR & NA CHOND-NA HYALUR IO KIT
PACK | INTRAOCULAR | Status: DC | PRN
  Administered 2023-05-22: 1 via OPHTHALMIC

## 2023-05-22 MED ORDER — SIGHTPATH DOSE#1 BSS IO SOLN
INTRAOCULAR | Status: DC | PRN
  Administered 2023-05-22: 15 mL via INTRAOCULAR

## 2023-05-22 MED ORDER — ARMC OPHTHALMIC DILATING DROPS
OPHTHALMIC | Status: AC
Start: 2023-05-22 — End: ?
  Filled 2023-05-22: qty 0.5

## 2023-05-22 MED ORDER — LIDOCAINE HCL (PF) 2 % IJ SOLN
INTRAOCULAR | Status: DC | PRN
  Administered 2023-05-22: 1 mL via INTRAOCULAR

## 2023-05-22 MED ORDER — FENTANYL CITRATE (PF) 100 MCG/2ML IJ SOLN
INTRAMUSCULAR | Status: DC | PRN
  Administered 2023-05-22 (×2): 50 ug via INTRAVENOUS

## 2023-05-22 MED ORDER — FENTANYL CITRATE (PF) 100 MCG/2ML IJ SOLN
INTRAMUSCULAR | Status: AC
  Filled 2023-05-22: qty 2

## 2023-05-22 MED ORDER — TETRACAINE HCL 0.5 % OP SOLN
1.0000 [drp] | OPHTHALMIC | Status: DC | PRN
  Administered 2023-05-22 (×3): 1 [drp] via OPHTHALMIC

## 2023-05-22 MED ORDER — ARMC OPHTHALMIC DILATING DROPS
1.0000 | OPHTHALMIC | Status: DC | PRN
  Administered 2023-05-22 (×3): 1 via OPHTHALMIC

## 2023-05-22 MED ORDER — DEXMEDETOMIDINE HCL IN NACL 200 MCG/50ML IV SOLN
INTRAVENOUS | Status: DC | PRN
  Administered 2023-05-22: 6 ug via INTRAVENOUS

## 2023-05-22 MED ORDER — MOXIFLOXACIN HCL 0.5 % OP SOLN
OPHTHALMIC | Status: DC | PRN
  Administered 2023-05-22: .2 mL via OPHTHALMIC

## 2023-05-22 MED ORDER — TETRACAINE HCL 0.5 % OP SOLN
OPHTHALMIC | Status: AC
Start: 2023-05-22 — End: ?
  Filled 2023-05-22: qty 4

## 2023-05-22 SURGICAL SUPPLY — 10 items
CATARACT SUITE SIGHTPATH (MISCELLANEOUS) ×1
DISSECTOR HYDRO NUCLEUS 50X22 (MISCELLANEOUS) ×1 IMPLANT
FEE CATARACT SUITE SIGHTPATH (MISCELLANEOUS) ×1 IMPLANT
GLOVE PI ULTRA LF STRL 7.5 (GLOVE) ×1 IMPLANT
GLOVE SURG POLYISOPRENE 8.5 (GLOVE) ×1
GLOVE SURG SYN 8.5 PF PI BL (GLOVE) ×1 IMPLANT
LENS IOL TECNIS EYHANCE 27.0 (Intraocular Lens) IMPLANT
NDL FILTER BLUNT 18X1 1/2 (NEEDLE) ×1 IMPLANT
NEEDLE FILTER BLUNT 18X1 1/2 (NEEDLE) ×1
SYR 3ML LL SCALE MARK (SYRINGE) ×1 IMPLANT

## 2023-05-22 NOTE — Transfer of Care (Signed)
Immediate Anesthesia Transfer of Care Note  Patient: Alexa Coleman  Procedure(s) Performed: CATARACT EXTRACTION PHACO AND INTRAOCULAR LENS PLACEMENT (IOC) LEFT 7.70, 00:44.9 (Left: Eye)  Patient Location: PACU  Anesthesia Type: MAC  Level of Consciousness: awake, alert  and patient cooperative  Airway and Oxygen Therapy: Patient Spontanous Breathing and Patient connected to supplemental oxygen  Post-op Assessment: Post-op Vital signs reviewed, Patient's Cardiovascular Status Stable, Respiratory Function Stable, Patent Airway and No signs of Nausea or vomiting  Post-op Vital Signs: Reviewed and stable  Complications: No notable events documented.

## 2023-05-22 NOTE — Op Note (Signed)
OPERATIVE NOTE  Bailley Guilford [id number] 05/22/2023   PREOPERATIVE DIAGNOSIS:  Nuclear sclerotic cataract left eye.  H25.12   POSTOPERATIVE DIAGNOSIS:    Nuclear sclerotic cataract left eye.     PROCEDURE:  Phacoemusification with posterior chamber intraocular lens placement of the left eye   LENS:   Implant Name Type Inv. Item Serial No. Manufacturer Lot No. LRB No. Used Action  LENS IOL TECNIS EYHANCE 27.0 - [id number] Intraocular Lens LENS IOL TECNIS EYHANCE 27.0 [id number] [employer]  Left 1 Implanted      Procedure(s): CATARACT EXTRACTION PHACO AND INTRAOCULAR LENS PLACEMENT (IOC) LEFT 7.70, 00:44.9 (Left)  SURGEON:  Willey Blade, MD, MPH   ANESTHESIA:  Topical with tetracaine drops augmented with 1% preservative-free intracameral lidocaine.  ESTIMATED BLOOD LOSS: <1 mL   COMPLICATIONS:  None.   DESCRIPTION OF PROCEDURE:  The patient was identified in the holding room and transported to the operating room and placed in the supine position under the operating microscope.  The left eye was identified as the operative eye and it was prepped and draped in the usual sterile ophthalmic fashion.   A 1.0 millimeter clear-corneal paracentesis was made at the 5:00 position. 0.5 ml of preservative-free 1% lidocaine with epinephrine was injected into the anterior chamber.  The anterior chamber was filled with viscoelastic.  A 2.4 millimeter keratome was used to make a near-clear corneal incision at the 2:00 position.  A curvilinear capsulorrhexis was made with a cystotome and capsulorrhexis forceps.  Balanced salt solution was used to hydrodissect and hydrodelineate the nucleus.   Phacoemulsification was then used in stop and chop fashion to remove the lens nucleus and epinucleus.  The remaining cortex was then removed using the irrigation and aspiration handpiece. Viscoelastic was then placed into the capsular bag to distend it for lens placement.  A lens was then injected into the capsular  bag.  The remaining viscoelastic was aspirated.   Wounds were hydrated with balanced salt solution.  The anterior chamber was inflated to a physiologic pressure with balanced salt solution.  Intracameral vigamox 0.1 mL undiltued was injected into the eye and a drop placed onto the ocular surface.  No wound leaks were noted.  The patient was taken to the recovery room in stable condition without complications of anesthesia or surgery  Willey Blade 05/22/2023, 10:46 AM

## 2023-05-22 NOTE — H&P (Signed)
[facility]   Primary Care Physician:  Margaretann Loveless, MD Ophthalmologist: Dr. Willey Blade  Pre-Procedure History & Physical: HPI:  Alexa Coleman is a 89 y.o. female here for cataract surgery.   Past Medical History:  Diagnosis Date   CAD involving native coronary artery without angina pectoris    High cholesterol    Hypertension     Past Surgical History:  Procedure Laterality Date   ABDOMINAL HYSTERECTOMY      Prior to Admission medications   Medication Sig Start Date End Date Taking? Authorizing Provider  alendronate (FOSAMAX) 70 MG tablet TAKE 1 TABLET EVERY WEEK 01/03/23  Yes Margaretann Loveless, MD  amLODipine (NORVASC) 5 MG tablet Take 1 tablet (5 mg total) by mouth daily. 04/06/23 04/05/24 Yes Adrian Blackwater A, MD  atorvastatin (LIPITOR) 40 MG tablet TAKE 1 TABLET EVERY DAY 11/02/22  Yes Margaretann Loveless, MD  clopidogrel (PLAVIX) 75 MG tablet TAKE 1 TABLET EVERY DAY 11/02/22  Yes Margaretann Loveless, MD  losartan (COZAAR) 100 MG tablet Take 1 tablet (100 mg total) by mouth daily. 05/12/23 05/11/24 Yes Laurier Nancy, MD  metoprolol succinate (TOPROL XL) 100 MG 24 hr tablet Take 1 tablet (100 mg total) by mouth daily. Take with or immediately following a meal. 11/04/22 11/04/23 Yes Laurier Nancy, MD  Vitamin D, Ergocalciferol, (DRISDOL) 1.25 MG (50000 UNIT) CAPS capsule TAKE 1 CAPSULE ONE TIME WEEKLY 03/23/23  Yes Margaretann Loveless, MD    Allergies as of 03/27/2023   (No Known Allergies)    Family History  Problem Relation Age of Onset   Breast cancer Neg Hx     Social History   Socioeconomic History   Marital status: Married    Spouse name: Not on file   Number of children: Not on file   Years of education: Not on file   Highest education level: Not on file  Occupational History   Not on file  Tobacco Use   Smoking status: Never   Smokeless tobacco: Never  Vaping Use   Vaping status: Never Used  Substance and Sexual Activity   Alcohol use: Never   Drug use: Never    Sexual activity: Not on file  Other Topics Concern   Not on file  Social History Narrative   Not on file   Social Drivers of Health   Financial Resource Strain: Not on file  Food Insecurity: Not on file  Transportation Needs: Not on file  Physical Activity: Not on file  Stress: Not on file  Social Connections: Not on file  Intimate Partner Violence: Not on file    Review of Systems: See HPI, otherwise negative ROS  Physical Exam: BP (!) 128/56   Pulse 69   Temp 97.9 F (36.6 C) (Temporal)   Resp (!) 22   Ht 5\' 2"  (1.575 m)   Wt 75.5 kg   SpO2 100%   BMI 30.45 kg/m  General:   Alert, cooperative in NAD Head:  Normocephalic and atraumatic. Respiratory:  Normal work of breathing. Cardiovascular:  RRR  Impression/Plan: Alexa Coleman is here for cataract surgery.  Risks, benefits, limitations, and alternatives regarding cataract surgery have been reviewed with the patient.  Questions have been answered.  All parties agreeable.   Willey Blade, MD  05/22/2023, 10:19 AM

## 2023-05-22 NOTE — Anesthesia Postprocedure Evaluation (Signed)
Anesthesia Post Note  Patient: Myrtice Lowdermilk  Procedure(s) Performed: CATARACT EXTRACTION PHACO AND INTRAOCULAR LENS PLACEMENT (IOC) LEFT 7.70, 00:44.9 (Left: Eye)  Patient location during evaluation: PACU Anesthesia Type: MAC Level of consciousness: awake and alert Pain management: pain level controlled Vital Signs Assessment: post-procedure vital signs reviewed and stable Respiratory status: spontaneous breathing, nonlabored ventilation, respiratory function stable and patient connected to nasal cannula oxygen Cardiovascular status: stable and blood pressure returned to baseline Postop Assessment: no apparent nausea or vomiting Anesthetic complications: no   No notable events documented.   Last Vitals:  Vitals:   05/22/23 1050 05/22/23 1053  BP: 97/76 94/70  Pulse: 66 67  Resp: 12 17  Temp:  (!) 36.1 C  SpO2: 99% 97%    Last Pain:  Vitals:   05/22/23 1053  TempSrc:   PainSc: 0-No pain                 Courteny Egler C Regino Fournet

## 2023-05-23 ENCOUNTER — Encounter: Payer: Self-pay | Admitting: Ophthalmology

## 2023-05-23 DIAGNOSIS — H2511 Age-related nuclear cataract, right eye: Secondary | ICD-10-CM | POA: Diagnosis not present

## 2023-06-02 NOTE — Discharge Instructions (Signed)

## 2023-06-05 ENCOUNTER — Ambulatory Visit: Payer: Medicare HMO | Admitting: Anesthesiology

## 2023-06-05 ENCOUNTER — Other Ambulatory Visit: Payer: Self-pay

## 2023-06-05 ENCOUNTER — Ambulatory Visit
Admission: RE | Admit: 2023-06-05 | Discharge: 2023-06-05 | Disposition: A | Discharge status: Home / Self Care | Payer: Medicare HMO | Attending: Ophthalmology | Admitting: Ophthalmology

## 2023-06-05 ENCOUNTER — Encounter
Admission: RE | Disposition: A | Discharge status: Home / Self Care | Payer: Self-pay | Source: Home / Self Care | Attending: Ophthalmology

## 2023-06-05 DIAGNOSIS — I10 Essential (primary) hypertension: Secondary | ICD-10-CM | POA: Diagnosis not present

## 2023-06-05 DIAGNOSIS — I2510 Atherosclerotic heart disease of native coronary artery without angina pectoris: Secondary | ICD-10-CM | POA: Diagnosis not present

## 2023-06-05 DIAGNOSIS — I340 Nonrheumatic mitral (valve) insufficiency: Secondary | ICD-10-CM | POA: Diagnosis not present

## 2023-06-05 DIAGNOSIS — H2511 Age-related nuclear cataract, right eye: Secondary | ICD-10-CM | POA: Insufficient documentation

## 2023-06-05 HISTORY — PX: CATARACT EXTRACTION W/PHACO: SHX586

## 2023-06-05 SURGERY — PHACOEMULSIFICATION, CATARACT, WITH IOL INSERTION
Anesthesia: Monitor Anesthesia Care | Laterality: Right

## 2023-06-05 MED ORDER — TETRACAINE HCL 0.5 % OP SOLN
OPHTHALMIC | Status: AC
  Filled 2023-06-05: qty 4

## 2023-06-05 MED ORDER — TETRACAINE HCL 0.5 % OP SOLN
1.0000 [drp] | OPHTHALMIC | Status: DC | PRN
  Administered 2023-06-05 (×3): 1 [drp] via OPHTHALMIC

## 2023-06-05 MED ORDER — FENTANYL CITRATE (PF) 100 MCG/2ML IJ SOLN
INTRAMUSCULAR | Status: DC | PRN
  Administered 2023-06-05: 50 ug via INTRAVENOUS

## 2023-06-05 MED ORDER — FENTANYL CITRATE (PF) 100 MCG/2ML IJ SOLN
INTRAMUSCULAR | Status: AC
  Filled 2023-06-05: qty 2

## 2023-06-05 MED ORDER — SIGHTPATH DOSE#1 NA HYALUR & NA CHOND-NA HYALUR IO KIT
PACK | INTRAOCULAR | Status: DC | PRN
  Administered 2023-06-05: 1 via OPHTHALMIC

## 2023-06-05 MED ORDER — MOXIFLOXACIN HCL 0.5 % OP SOLN
OPHTHALMIC | Status: DC | PRN
  Administered 2023-06-05: .2 mL via OPHTHALMIC

## 2023-06-05 MED ORDER — ARMC OPHTHALMIC DILATING DROPS
1.0000 | OPHTHALMIC | Status: DC | PRN
  Administered 2023-06-05 (×3): 1 via OPHTHALMIC

## 2023-06-05 MED ORDER — SIGHTPATH DOSE#1 BSS IO SOLN
INTRAOCULAR | Status: DC | PRN
  Administered 2023-06-05: 92 mL via OPHTHALMIC

## 2023-06-05 MED ORDER — MIDAZOLAM HCL 2 MG/2ML IJ SOLN
INTRAMUSCULAR | Status: DC | PRN
  Administered 2023-06-05: 1 mg via INTRAVENOUS

## 2023-06-05 MED ORDER — SIGHTPATH DOSE#1 BSS IO SOLN
INTRAOCULAR | Status: DC | PRN
  Administered 2023-06-05: 15 mL via INTRAOCULAR

## 2023-06-05 MED ORDER — ARMC OPHTHALMIC DILATING DROPS
OPHTHALMIC | Status: AC
Start: 2023-06-05 — End: ?
  Filled 2023-06-05: qty 0.5

## 2023-06-05 MED ORDER — MIDAZOLAM HCL 2 MG/2ML IJ SOLN
INTRAMUSCULAR | Status: AC
  Filled 2023-06-05: qty 2

## 2023-06-05 MED ORDER — LIDOCAINE HCL (PF) 2 % IJ SOLN
INTRAOCULAR | Status: DC | PRN
  Administered 2023-06-05: 4 mL via INTRAOCULAR

## 2023-06-05 SURGICAL SUPPLY — 10 items
CATARACT SUITE SIGHTPATH (MISCELLANEOUS) ×1
DISSECTOR HYDRO NUCLEUS 50X22 (MISCELLANEOUS) ×1 IMPLANT
FEE CATARACT SUITE SIGHTPATH (MISCELLANEOUS) ×1 IMPLANT
GLOVE PI ULTRA LF STRL 7.5 (GLOVE) ×1 IMPLANT
GLOVE SURG POLYISOPRENE 8.5 (GLOVE) ×1
GLOVE SURG SYN 8.5 PF PI BL (GLOVE) ×1 IMPLANT
LENS IOL TECNIS EYHANCE 23.0 (Intraocular Lens) IMPLANT
NDL FILTER BLUNT 18X1 1/2 (NEEDLE) ×1 IMPLANT
NEEDLE FILTER BLUNT 18X1 1/2 (NEEDLE) ×1
SYR 3ML LL SCALE MARK (SYRINGE) ×1 IMPLANT

## 2023-06-05 NOTE — Op Note (Signed)
OPERATIVE NOTE  Alexa Coleman [id number] 06/05/2023   PREOPERATIVE DIAGNOSIS:  Nuclear sclerotic cataract right eye.  H25.11   POSTOPERATIVE DIAGNOSIS:    Nuclear sclerotic cataract right eye.     PROCEDURE:  Phacoemusification with posterior chamber intraocular lens placement of the right eye   LENS:   Implant Name Type Inv. Item Serial No. Manufacturer Lot No. LRB No. Used Action  LENS IOL TECNIS EYHANCE 23.0 - [id number] Intraocular Lens LENS IOL TECNIS EYHANCE 23.0 [id number] [employer]  Right 1 Implanted       Procedure(s): CATARACT EXTRACTION PHACO AND INTRAOCULAR LENS PLACEMENT (IOC) RIGHT 9.31 00:53.2 (Right)  SURGEON:  Willey Blade, MD, MPH  ANESTHESIOLOGIST: Anesthesiologist: Marisue Humble, MD CRNA: Deland Pretty, CRNA   ANESTHESIA:  Topical with tetracaine drops augmented with 1% preservative-free intracameral lidocaine.  ESTIMATED BLOOD LOSS: less than 1 mL.   COMPLICATIONS:  None.   DESCRIPTION OF PROCEDURE:  The patient was identified in the holding room and transported to the operating room and placed in the supine position under the operating microscope.  The right eye was identified as the operative eye and it was prepped and draped in the usual sterile ophthalmic fashion.   A 1.0 millimeter clear-corneal paracentesis was made at the 10:30 position. 0.5 ml of preservative-free 1% lidocaine with epinephrine was injected into the anterior chamber.  The anterior chamber was filled with viscoelastic.  A 2.4 millimeter keratome was used to make a near-clear corneal incision at the 8:00 position.  A curvilinear capsulorrhexis was made with a cystotome and capsulorrhexis forceps.  Balanced salt solution was used to hydrodissect and hydrodelineate the nucleus.   Phacoemulsification was then used in stop and chop fashion to remove the lens nucleus and epinucleus.  The remaining cortex was then removed using the irrigation and aspiration handpiece. Viscoelastic was then  placed into the capsular bag to distend it for lens placement.  A lens was then injected into the capsular bag.  The remaining viscoelastic was aspirated.   Wounds were hydrated with balanced salt solution.  The anterior chamber was inflated to a physiologic pressure with balanced salt solution.   Intracameral vigamox 0.1 mL undiluted was injected into the eye and a drop placed onto the ocular surface.  No wound leaks were noted.  The patient was taken to the recovery room in stable condition without complications of anesthesia or surgery  Willey Blade 06/05/2023, 8:21 AM

## 2023-06-05 NOTE — Anesthesia Preprocedure Evaluation (Addendum)
Anesthesia Evaluation  Patient identified by MRN, date of birth, ID band Patient awake    Reviewed: Allergy & Precautions, H&P , NPO status , Patient's Chart, lab work & pertinent test results  Airway Mallampati: IV  TM Distance: <3 FB Neck ROM: Full   Comment: Comment: Barely 1 FB TMD, would expect very anterior airway, and likely difficult airway if intubation were needed.  Plan MAC with oxygen cannula today. Dental no notable dental hx.    Pulmonary neg pulmonary ROS   Pulmonary exam normal breath sounds clear to auscultation       Cardiovascular hypertension, + CAD  Normal cardiovascular exam Rhythm:Regular Rate:Normal  CAD without angina, do not see echo nor heart cath, nor other documentation on Epic   Neuro/Psych negative neurological ROS  negative psych ROS   GI/Hepatic negative GI ROS, Neg liver ROS,,,  Endo/Other  negative endocrine ROS    Renal/GU negative Renal ROS  negative genitourinary   Musculoskeletal negative musculoskeletal ROS (+)    Abdominal   Peds negative pediatric ROS (+)  Hematology negative hematology ROS (+)   Anesthesia Other Findings HTN CAD  CAD without angina, do not see echo nor heart cath, nor other documentation on Epic  Previous cataract surgery 05-22-23 Had 100 mcg IV fentanyl and 6 mcg IV precedex  Reproductive/Obstetrics negative OB ROS                              Anesthesia Physical Anesthesia Plan  ASA: 3  Anesthesia Plan: MAC   Post-op Pain Management:    Induction: Intravenous  PONV Risk Score and Plan:   Airway Management Planned: Natural Airway and Nasal Cannula  Additional Equipment:   Intra-op Plan:   Post-operative Plan:   Informed Consent: I have reviewed the patients History and Physical, chart, labs and discussed the procedure including the risks, benefits and alternatives for the proposed anesthesia with the patient  or authorized representative who has indicated his/her understanding and acceptance.     Dental Advisory Given  Plan Discussed with: Anesthesiologist, CRNA and Surgeon  Anesthesia Plan Comments: (Patient consented for risks of anesthesia including but not limited to:  - adverse reactions to medications - damage to eyes, teeth, lips or other oral mucosa - nerve damage due to positioning  - sore throat or hoarseness - Damage to heart, brain, nerves, lungs, other parts of body or loss of life  Patient voiced understanding and assent.)         Anesthesia Quick Evaluation

## 2023-06-05 NOTE — Transfer of Care (Signed)
Immediate Anesthesia Transfer of Care Note  Patient: Alexa Coleman  Procedure(s) Performed: CATARACT EXTRACTION PHACO AND INTRAOCULAR LENS PLACEMENT (IOC) RIGHT 9.31 00:53.2 (Right)  Patient Location: PACU  Anesthesia Type: MAC  Level of Consciousness: awake, alert  and patient cooperative  Airway and Oxygen Therapy: Patient Spontanous Breathing and Patient connected to supplemental oxygen  Post-op Assessment: Post-op Vital signs reviewed, Patient's Cardiovascular Status Stable, Respiratory Function Stable, Patent Airway and No signs of Nausea or vomiting  Post-op Vital Signs: Reviewed and stable  Complications: No notable events documented.

## 2023-06-05 NOTE — Anesthesia Postprocedure Evaluation (Signed)
Anesthesia Post Note  Patient: Alexa Coleman  Procedure(s) Performed: CATARACT EXTRACTION PHACO AND INTRAOCULAR LENS PLACEMENT (IOC) RIGHT 9.31 00:53.2 (Right)  Patient location during evaluation: PACU Anesthesia Type: MAC Level of consciousness: awake and alert Pain management: pain level controlled Vital Signs Assessment: post-procedure vital signs reviewed and stable Respiratory status: spontaneous breathing, nonlabored ventilation, respiratory function stable and patient connected to nasal cannula oxygen Cardiovascular status: stable and blood pressure returned to baseline Postop Assessment: no apparent nausea or vomiting Anesthetic complications: no   No notable events documented.   Last Vitals:  Vitals:   06/05/23 0825 06/05/23 0827  BP: 111/60 (!) 109/56  Pulse: 63 (!) 58  Resp: 16 13  Temp:  36.5 C  SpO2: 99% 97%    Last Pain:  Vitals:   06/05/23 0827  TempSrc:   PainSc: 0-No pain                 Belladonna Lubinski C Marlow Berenguer

## 2023-06-05 NOTE — H&P (Signed)
[facility]   Primary Care Physician:  Margaretann Loveless, MD Ophthalmologist: Dr. Willey Blade  Pre-Procedure History & Physical: HPI:  Alexa Coleman is a 89 y.o. female here for cataract surgery.   Past Medical History:  Diagnosis Date   CAD involving native coronary artery without angina pectoris    High cholesterol    Hypertension     Past Surgical History:  Procedure Laterality Date   ABDOMINAL HYSTERECTOMY     CATARACT EXTRACTION W/PHACO Left 05/22/2023   Procedure: CATARACT EXTRACTION PHACO AND INTRAOCULAR LENS PLACEMENT (IOC) LEFT 7.70, 00:44.9;  Surgeon: Nevada Crane, MD;  Location: [facility];  Service: Ophthalmology;  Laterality: Left;    Prior to Admission medications   Medication Sig Start Date End Date Taking? Authorizing Provider  alendronate (FOSAMAX) 70 MG tablet TAKE 1 TABLET EVERY WEEK 01/03/23  Yes Margaretann Loveless, MD  amLODipine (NORVASC) 5 MG tablet Take 1 tablet (5 mg total) by mouth daily. 04/06/23 04/05/24 Yes Adrian Blackwater A, MD  atorvastatin (LIPITOR) 40 MG tablet TAKE 1 TABLET EVERY DAY 11/02/22  Yes Margaretann Loveless, MD  clopidogrel (PLAVIX) 75 MG tablet TAKE 1 TABLET EVERY DAY 11/02/22  Yes Margaretann Loveless, MD  losartan (COZAAR) 100 MG tablet Take 1 tablet (100 mg total) by mouth daily. 05/12/23 05/11/24 Yes Laurier Nancy, MD  metoprolol succinate (TOPROL XL) 100 MG 24 hr tablet Take 1 tablet (100 mg total) by mouth daily. Take with or immediately following a meal. 11/04/22 11/04/23 Yes Laurier Nancy, MD  Vitamin D, Ergocalciferol, (DRISDOL) 1.25 MG (50000 UNIT) CAPS capsule TAKE 1 CAPSULE ONE TIME WEEKLY 03/23/23  Yes Margaretann Loveless, MD    Allergies as of 03/27/2023   (No Known Allergies)    Family History  Problem Relation Age of Onset   Breast cancer Neg Hx     Social History   Socioeconomic History   Marital status: Married    Spouse name: Not on file   Number of children: Not on file   Years of education: Not on file    Highest education level: Not on file  Occupational History   Not on file  Tobacco Use   Smoking status: Never   Smokeless tobacco: Never  Vaping Use   Vaping status: Never Used  Substance and Sexual Activity   Alcohol use: Never   Drug use: Never   Sexual activity: Not on file  Other Topics Concern   Not on file  Social History Narrative   Not on file   Social Drivers of Health   Financial Resource Strain: Not on file  Food Insecurity: Not on file  Transportation Needs: Not on file  Physical Activity: Not on file  Stress: Not on file  Social Connections: Not on file  Intimate Partner Violence: Not on file    Review of Systems: See HPI, otherwise negative ROS  Physical Exam: BP 126/67   Pulse 87   Temp (!) 97.2 F (36.2 C) (Temporal)   Resp 13   Wt 74.8 kg   SpO2 98%   BMI 30.18 kg/m  General:   Alert, cooperative in NAD Head:  Normocephalic and atraumatic. Respiratory:  Normal work of breathing. Cardiovascular:  RRR  Impression/Plan: Alexa Coleman is here for cataract surgery.  Risks, benefits, limitations, and alternatives regarding cataract surgery have been reviewed with the patient.  Questions have been answered.  All parties agreeable.   Willey Blade, MD  06/05/2023, 7:59 AM

## 2023-06-06 ENCOUNTER — Encounter: Payer: Self-pay | Admitting: Ophthalmology

## 2023-06-30 DIAGNOSIS — H524 Presbyopia: Secondary | ICD-10-CM | POA: Diagnosis not present

## 2023-08-10 ENCOUNTER — Encounter: Payer: Self-pay | Admitting: Cardiovascular Disease

## 2023-08-10 ENCOUNTER — Ambulatory Visit: Payer: Medicare HMO | Admitting: Cardiovascular Disease

## 2023-08-10 VITALS — BP 120/70 | HR 79 | Ht 62.0 in | Wt 163.0 lb

## 2023-08-10 DIAGNOSIS — I2510 Atherosclerotic heart disease of native coronary artery without angina pectoris: Secondary | ICD-10-CM

## 2023-08-10 DIAGNOSIS — E782 Mixed hyperlipidemia: Secondary | ICD-10-CM | POA: Diagnosis not present

## 2023-08-10 DIAGNOSIS — R000 Tachycardia, unspecified: Secondary | ICD-10-CM | POA: Diagnosis not present

## 2023-08-10 DIAGNOSIS — I10 Essential (primary) hypertension: Secondary | ICD-10-CM | POA: Diagnosis not present

## 2023-08-10 DIAGNOSIS — I340 Nonrheumatic mitral (valve) insufficiency: Secondary | ICD-10-CM

## 2023-08-10 MED ORDER — FUROSEMIDE 20 MG PO TABS: ORAL_TABLET | ORAL | 2 refills | Status: DC

## 2023-08-10 NOTE — Progress Notes (Signed)
 Cardiology Office Note   Date:  08/10/2023   ID:  Alexa Coleman, DOB May 28, 1933, MRN [id number]  PCP:  Aisha Hove, MD  Cardiologist:  Debborah Fairly, MD      History of Present Illness: Alexa Coleman is a 89 y.o. female who presents for  Chief Complaint  Patient presents with   Follow-up    3 month follow up    Has swelling legs after amlodapine started      Past Medical History:  Diagnosis Date   CAD involving native coronary artery without angina pectoris    High cholesterol    Hypertension      Past Surgical History:  Procedure Laterality Date   ABDOMINAL HYSTERECTOMY     CATARACT EXTRACTION W/PHACO Left 05/22/2023   Procedure: CATARACT EXTRACTION PHACO AND INTRAOCULAR LENS PLACEMENT (IOC) LEFT 7.70, 00:44.9;  Surgeon: Rosa College, MD;  Location: [facility];  Service: Ophthalmology;  Laterality: Left;   CATARACT EXTRACTION W/PHACO Right 06/05/2023   Procedure: CATARACT EXTRACTION PHACO AND INTRAOCULAR LENS PLACEMENT (IOC) RIGHT 9.31 00:53.2;  Surgeon: Rosa College, MD;  Location: [facility];  Service: Ophthalmology;  Laterality: Right;     Current Outpatient Medications  Medication Sig Dispense Refill   alendronate (FOSAMAX) 70 MG tablet TAKE 1 TABLET EVERY WEEK 12 tablet 3   atorvastatin (LIPITOR) 40 MG tablet TAKE 1 TABLET EVERY DAY 90 tablet 3   clopidogrel (PLAVIX) 75 MG tablet TAKE 1 TABLET EVERY DAY 90 tablet 3   furosemide  (LASIX ) 20 MG tablet Take 1 tab every other day 30 tablet 2   losartan  (COZAAR ) 100 MG tablet Take 1 tablet (100 mg total) by mouth daily. 30 tablet 11   metoprolol  succinate (TOPROL  XL) 100 MG 24 hr tablet Take 1 tablet (100 mg total) by mouth daily. Take with or immediately following a meal. 30 tablet 11   Vitamin D , Ergocalciferol , (DRISDOL) 1.25 MG (50000 UNIT) CAPS capsule TAKE 1 CAPSULE ONE TIME WEEKLY 12 capsule 3   No current facility-administered medications for this visit.    Allergies:    Lisinopril and Other    Social History:   reports that she has never smoked. She has never used smokeless tobacco. She reports that she does not drink alcohol and does not use drugs.   Family History:  family history is not on file.    ROS:     Review of Systems  Constitutional: Negative.   HENT: Negative.    Eyes: Negative.   Respiratory: Negative.    Gastrointestinal: Negative.   Genitourinary: Negative.   Musculoskeletal: Negative.   Skin: Negative.   Neurological: Negative.   Endo/Heme/Allergies: Negative.   Psychiatric/Behavioral: Negative.    All other systems reviewed and are negative.     All other systems are reviewed and negative.    PHYSICAL EXAM: VS:  BP 120/70   Pulse 79   Ht 5\' 2"  (1.575 m)   Wt 163 lb (73.9 kg)   SpO2 97%   BMI 29.81 kg/m  , BMI Body mass index is 29.81 kg/m. Last weight:  Wt Readings from Last 3 Encounters:  08/10/23 163 lb (73.9 kg)  06/05/23 165 lb (74.8 kg)  05/22/23 166 lb 8 oz (75.5 kg)     Physical Exam Constitutional:      Appearance: Normal appearance.  Cardiovascular:     Rate and Rhythm: Normal rate and regular rhythm.     Heart sounds: Normal heart sounds.  Pulmonary:  Effort: Pulmonary effort is normal.     Breath sounds: Normal breath sounds.  Musculoskeletal:     Right lower leg: No edema.     Left lower leg: No edema.  Neurological:     Mental Status: She is alert.       EKG:   Recent Labs: 10/25/2022: Hemoglobin 13.5; Platelets 242 05/15/2023: ALT 20; BUN 21; Creatinine, Ser 1.42; Potassium 5.1; Sodium 140    Lipid Panel    Component Value Date/Time   CHOL 121 05/15/2023 0929   TRIG 127 05/15/2023 0929   HDL 40 05/15/2023 0929   CHOLHDL 3.6 10/25/2022 1215   LDLCALC 58 05/15/2023 0929      Other studies Reviewed: Additional studies/ records that were reviewed today include:  Review of the above records demonstrates:       No data to display            ASSESSMENT AND PLAN:     ICD-10-CM   1. Mixed hyperlipidemia  E78.2 furosemide  (LASIX ) 20 MG tablet    2. Nonrheumatic mitral valve regurgitation  I34.0 furosemide  (LASIX ) 20 MG tablet    3. Coronary artery disease involving native coronary artery of native heart without angina pectoris  I25.10 furosemide  (LASIX ) 20 MG tablet    4. Primary hypertension  I10 furosemide  (LASIX ) 20 MG tablet   Has swelling of legs, and related to amlodapine. Will stop amlodapine and give lasix  PRN, as creat 1.4    5. Sinus tachycardia seen on cardiac monitor  R00.0 furosemide  (LASIX ) 20 MG tablet       Problem List Items Addressed This Visit       Cardiovascular and Mediastinum   Primary hypertension   Relevant Medications   furosemide  (LASIX ) 20 MG tablet   Coronary artery disease involving native coronary artery of native heart without angina pectoris   Relevant Medications   furosemide  (LASIX ) 20 MG tablet   Nonrheumatic mitral valve regurgitation   Relevant Medications   furosemide  (LASIX ) 20 MG tablet     Other   Mixed hyperlipidemia - Primary   Relevant Medications   furosemide  (LASIX ) 20 MG tablet   Other Visit Diagnoses       Sinus tachycardia seen on cardiac monitor       Relevant Medications   furosemide  (LASIX ) 20 MG tablet          Disposition:   Return in about 2 months (around 10/10/2023).    Total time spent: 30 minutes  Signed,  Debborah Fairly, MD  08/10/2023 1:26 PM    [facility]

## 2023-08-23 ENCOUNTER — Other Ambulatory Visit: Payer: Self-pay | Admitting: Internal Medicine

## 2023-09-26 DIAGNOSIS — H40053 Ocular hypertension, bilateral: Secondary | ICD-10-CM | POA: Diagnosis not present

## 2023-10-05 DIAGNOSIS — H11002 Unspecified pterygium of left eye: Secondary | ICD-10-CM | POA: Diagnosis not present

## 2023-10-05 DIAGNOSIS — H02103 Unspecified ectropion of right eye, unspecified eyelid: Secondary | ICD-10-CM | POA: Diagnosis not present

## 2023-10-05 DIAGNOSIS — Z961 Presence of intraocular lens: Secondary | ICD-10-CM | POA: Diagnosis not present

## 2023-10-05 DIAGNOSIS — H40053 Ocular hypertension, bilateral: Secondary | ICD-10-CM | POA: Diagnosis not present

## 2023-10-12 ENCOUNTER — Encounter: Payer: Self-pay | Admitting: Cardiovascular Disease

## 2023-10-12 ENCOUNTER — Ambulatory Visit: Admitting: Cardiovascular Disease

## 2023-10-12 VITALS — BP 124/74 | HR 65 | Ht 62.0 in | Wt 164.0 lb

## 2023-10-12 DIAGNOSIS — R000 Tachycardia, unspecified: Secondary | ICD-10-CM | POA: Diagnosis not present

## 2023-10-12 DIAGNOSIS — E782 Mixed hyperlipidemia: Secondary | ICD-10-CM | POA: Diagnosis not present

## 2023-10-12 DIAGNOSIS — I10 Essential (primary) hypertension: Secondary | ICD-10-CM

## 2023-10-12 DIAGNOSIS — I340 Nonrheumatic mitral (valve) insufficiency: Secondary | ICD-10-CM

## 2023-10-12 DIAGNOSIS — I2510 Atherosclerotic heart disease of native coronary artery without angina pectoris: Secondary | ICD-10-CM

## 2023-10-12 NOTE — Progress Notes (Signed)
 Cardiology Office Note   Date:  10/12/2023   ID:  Alexa Coleman, DOB 05-10-1933, MRN [id number]  PCP:  Fernand Fredy RAMAN, MD  Cardiologist:  Denyse Fernand, MD      History of Present Illness: Alexa Coleman is a 89 y.o. female who presents for  Chief Complaint  Patient presents with   Follow-up    2 month follow up    Has DOE      Past Medical History:  Diagnosis Date   CAD involving native coronary artery without angina pectoris    High cholesterol    Hypertension      Past Surgical History:  Procedure Laterality Date   ABDOMINAL HYSTERECTOMY     CATARACT EXTRACTION W/PHACO Left 05/22/2023   Procedure: CATARACT EXTRACTION PHACO AND INTRAOCULAR LENS PLACEMENT (IOC) LEFT 7.70, 00:44.9;  Surgeon: Myrna Adine Anes, MD;  Location: [facility];  Service: Ophthalmology;  Laterality: Left;   CATARACT EXTRACTION W/PHACO Right 06/05/2023   Procedure: CATARACT EXTRACTION PHACO AND INTRAOCULAR LENS PLACEMENT (IOC) RIGHT 9.31 00:53.2;  Surgeon: Myrna Adine Anes, MD;  Location: [facility];  Service: Ophthalmology;  Laterality: Right;     Current Outpatient Medications  Medication Sig Dispense Refill   alendronate (FOSAMAX) 70 MG tablet TAKE 1 TABLET EVERY WEEK 12 tablet 3   atorvastatin (LIPITOR) 40 MG tablet TAKE 1 TABLET EVERY DAY 90 tablet 3   clopidogrel (PLAVIX) 75 MG tablet TAKE 1 TABLET EVERY DAY 90 tablet 3   losartan  (COZAAR ) 100 MG tablet Take 1 tablet (100 mg total) by mouth daily. 30 tablet 11   metoprolol  succinate (TOPROL -XL) 50 MG 24 hr tablet TAKE 1 TABLET EVERY DAY 90 tablet 3   Vitamin D , Ergocalciferol , (DRISDOL) 1.25 MG (50000 UNIT) CAPS capsule TAKE 1 CAPSULE ONE TIME WEEKLY 12 capsule 3   furosemide  (LASIX ) 20 MG tablet Take 1 tab every other day (Patient not taking: Reported on 10/12/2023) 30 tablet 2   metoprolol  succinate (TOPROL  XL) 100 MG 24 hr tablet Take 1 tablet (100 mg total) by mouth daily. Take with or immediately following a meal.  30 tablet 11   No current facility-administered medications for this visit.    Allergies:   Lisinopril and Other    Social History:   reports that she has never smoked. She has never used smokeless tobacco. She reports that she does not drink alcohol and does not use drugs.   Family History:  family history is not on file.    ROS:     Review of Systems  Constitutional: Negative.   HENT: Negative.    Eyes: Negative.   Respiratory: Negative.    Gastrointestinal: Negative.   Genitourinary: Negative.   Musculoskeletal: Negative.   Skin: Negative.   Neurological: Negative.   Endo/Heme/Allergies: Negative.   Psychiatric/Behavioral: Negative.    All other systems reviewed and are negative.     All other systems are reviewed and negative.    PHYSICAL EXAM: VS:  BP 124/74   Pulse 65   Ht 5' 2 (1.575 m)   Wt 164 lb (74.4 kg)   SpO2 97%   BMI 30.00 kg/m  , BMI Body mass index is 30 kg/m. Last weight:  Wt Readings from Last 3 Encounters:  10/12/23 164 lb (74.4 kg)  08/10/23 163 lb (73.9 kg)  06/05/23 165 lb (74.8 kg)     Physical Exam Constitutional:      Appearance: Normal appearance.   Cardiovascular:  Rate and Rhythm: Normal rate and regular rhythm.     Heart sounds: Normal heart sounds.  Pulmonary:     Effort: Pulmonary effort is normal.     Breath sounds: Normal breath sounds.   Musculoskeletal:     Right lower leg: No edema.     Left lower leg: No edema.   Neurological:     Mental Status: She is alert.       EKG:   Recent Labs: 10/25/2022: Hemoglobin 13.5; Platelets 242 05/15/2023: ALT 20; BUN 21; Creatinine, Ser 1.42; Potassium 5.1; Sodium 140    Lipid Panel    Component Value Date/Time   CHOL 121 05/15/2023 0929   TRIG 127 05/15/2023 0929   HDL 40 05/15/2023 0929   CHOLHDL 3.6 10/25/2022 1215   LDLCALC 58 05/15/2023 0929      Other studies Reviewed: Additional studies/ records that were reviewed today include:  Review of the  above records demonstrates:       No data to display            ASSESSMENT AND PLAN:    ICD-10-CM   1. Mixed hyperlipidemia  E78.2     2. Nonrheumatic mitral valve regurgitation  I34.0     3. Coronary artery disease involving native coronary artery of native heart without angina pectoris  I25.10     4. Primary hypertension  I10     5. Sinus tachycardia seen on cardiac monitor  R00.0    HR is good on metoprolol        Problem List Items Addressed This Visit       Cardiovascular and Mediastinum   Primary hypertension   Coronary artery disease involving native coronary artery of native heart without angina pectoris   Nonrheumatic mitral valve regurgitation     Other   Mixed hyperlipidemia - Primary   Other Visit Diagnoses       Sinus tachycardia seen on cardiac monitor       HR is good on metoprolol           Disposition:   Return in about 3 months (around 01/12/2024).    Total time spent: 30 minutes  Signed,  Denyse Bathe, MD  10/12/2023 1:36 PM    [facility]

## 2023-11-02 ENCOUNTER — Encounter: Payer: Self-pay | Admitting: Internal Medicine

## 2023-11-02 ENCOUNTER — Ambulatory Visit (INDEPENDENT_AMBULATORY_CARE_PROVIDER_SITE_OTHER): Payer: Medicare HMO | Admitting: Internal Medicine

## 2023-11-02 VITALS — BP 138/78 | HR 64 | Ht 62.0 in | Wt 164.2 lb

## 2023-11-02 DIAGNOSIS — E782 Mixed hyperlipidemia: Secondary | ICD-10-CM

## 2023-11-02 DIAGNOSIS — I10 Essential (primary) hypertension: Secondary | ICD-10-CM

## 2023-11-02 DIAGNOSIS — R7303 Prediabetes: Secondary | ICD-10-CM

## 2023-11-02 DIAGNOSIS — I2510 Atherosclerotic heart disease of native coronary artery without angina pectoris: Secondary | ICD-10-CM

## 2023-11-02 NOTE — Progress Notes (Signed)
 Established Patient Office Visit  Subjective:  Patient ID: Alexa Coleman, female    DOB: 14-Oct-1933  Age: 89 y.o. MRN: [id number]  Chief Complaint  Patient presents with   Follow-up    6 month follow up    Patient comes in for follow-up today.  Her initial blood pressure was high but repeat looks better.  She is generally feeling well.  She has undergone eye surgery.  She is taking all her medications regularly and has no new complaints today.    No other concerns at this time.   Past Medical History:  Diagnosis Date   CAD involving native coronary artery without angina pectoris    High cholesterol    Hypertension     Past Surgical History:  Procedure Laterality Date   ABDOMINAL HYSTERECTOMY     CATARACT EXTRACTION W/PHACO Left 05/22/2023   Procedure: CATARACT EXTRACTION PHACO AND INTRAOCULAR LENS PLACEMENT (IOC) LEFT 7.70, 00:44.9;  Surgeon: Myrna Adine Anes, MD;  Location: [facility];  Service: Ophthalmology;  Laterality: Left;   CATARACT EXTRACTION W/PHACO Right 06/05/2023   Procedure: CATARACT EXTRACTION PHACO AND INTRAOCULAR LENS PLACEMENT (IOC) RIGHT 9.31 00:53.2;  Surgeon: Myrna Adine Anes, MD;  Location: [facility];  Service: Ophthalmology;  Laterality: Right;    Social History   Socioeconomic History   Marital status: Married    Spouse name: Not on file   Number of children: Not on file   Years of education: Not on file   Highest education level: Not on file  Occupational History   Not on file  Tobacco Use   Smoking status: Never   Smokeless tobacco: Never  Vaping Use   Vaping status: Never Used  Substance and Sexual Activity   Alcohol use: Never   Drug use: Never   Sexual activity: Not on file  Other Topics Concern   Not on file  Social History Narrative   Not on file   Social Drivers of Health   Financial Resource Strain: Not on file  Food Insecurity: Not on file  Transportation Needs: Not on file  Physical Activity: Not on  file  Stress: Not on file  Social Connections: Not on file  Intimate Partner Violence: Not on file    Family History  Problem Relation Age of Onset   Breast cancer Neg Hx     Allergies  Allergen Reactions   Lisinopril Cough   Other     Preservatives in eye drops cause eye to be blood red    Outpatient Medications Prior to Visit  Medication Sig   alendronate (FOSAMAX) 70 MG tablet TAKE 1 TABLET EVERY WEEK   atorvastatin (LIPITOR) 40 MG tablet TAKE 1 TABLET EVERY DAY   clopidogrel (PLAVIX) 75 MG tablet TAKE 1 TABLET EVERY DAY   losartan  (COZAAR ) 100 MG tablet Take 1 tablet (100 mg total) by mouth daily.   metoprolol  succinate (TOPROL -XL) 50 MG 24 hr tablet TAKE 1 TABLET EVERY DAY   Vitamin D , Ergocalciferol , (DRISDOL) 1.25 MG (50000 UNIT) CAPS capsule TAKE 1 CAPSULE ONE TIME WEEKLY   furosemide  (LASIX ) 20 MG tablet Take 1 tab every other day (Patient not taking: Reported on 11/02/2023)   metoprolol  succinate (TOPROL  XL) 100 MG 24 hr tablet Take 1 tablet (100 mg total) by mouth daily. Take with or immediately following a meal. (Patient not taking: Reported on 11/02/2023)   No facility-administered medications prior to visit.    Review of Systems  Constitutional: Negative.  Negative for chills, diaphoresis,  fever, malaise/fatigue and weight loss.  HENT: Negative.  Negative for sore throat.   Eyes: Negative.   Respiratory: Negative.  Negative for cough and shortness of breath.   Cardiovascular: Negative.  Negative for chest pain, palpitations and leg swelling.  Gastrointestinal: Negative.  Negative for abdominal pain, constipation, diarrhea, heartburn, nausea and vomiting.  Genitourinary: Negative.  Negative for dysuria and flank pain.  Musculoskeletal: Negative.  Negative for joint pain and myalgias.  Skin: Negative.   Neurological: Negative.  Negative for dizziness, tingling, tremors and headaches.  Endo/Heme/Allergies: Negative.   Psychiatric/Behavioral: Negative.  Negative  for depression and suicidal ideas. The patient is not nervous/anxious.        Objective:   BP 138/78   Pulse 64   Ht 5' 2 (1.575 m)   Wt 164 lb 3.2 oz (74.5 kg)   SpO2 95%   BMI 30.03 kg/m   Vitals:   11/02/23 1308 11/02/23 1323  BP: (!) 150/78 138/78  Pulse: 64   Height: 5' 2 (1.575 m)   Weight: 164 lb 3.2 oz (74.5 kg)   SpO2: 95%   BMI (Calculated): 30.03     Physical Exam Vitals and nursing note reviewed.  Constitutional:      Appearance: Normal appearance.  HENT:     Head: Normocephalic and atraumatic.     Nose: Nose normal.     Mouth/Throat:     Mouth: Mucous membranes are moist.     Pharynx: Oropharynx is clear.  Eyes:     Conjunctiva/sclera: Conjunctivae normal.     Pupils: Pupils are equal, round, and reactive to light.  Cardiovascular:     Rate and Rhythm: Normal rate and regular rhythm.     Pulses: Normal pulses.     Heart sounds: Normal heart sounds. No murmur heard. Pulmonary:     Effort: Pulmonary effort is normal.     Breath sounds: Normal breath sounds. No wheezing.  Abdominal:     General: Bowel sounds are normal.     Palpations: Abdomen is soft.     Tenderness: There is no abdominal tenderness. There is no right CVA tenderness or left CVA tenderness.  Musculoskeletal:        General: Normal range of motion.     Cervical back: Normal range of motion.     Right lower leg: No edema.     Left lower leg: No edema.  Skin:    General: Skin is warm and dry.  Neurological:     General: No focal deficit present.     Mental Status: She is alert and oriented to person, place, and time.  Psychiatric:        Mood and Affect: Mood normal.        Behavior: Behavior normal.      No results found for any visits on 11/02/23.  No results found for this or any previous visit (from the past 2160 hours).    Assessment & Plan:  Continue current medications.  Check blood work today. Problem List Items Addressed This Visit     Mixed hyperlipidemia    Relevant Orders   Lipid Panel w/o Chol/HDL Ratio   CMP14+EGFR   Prediabetes   Relevant Orders   Hemoglobin A1c   Primary hypertension - Primary   Relevant Orders   CMP14+EGFR   Coronary artery disease involving native coronary artery of native heart without angina pectoris    Return in about 6 months (around 05/04/2024).   Total time spent: 30 minutes  FERNAND FREDY RAMAN, MD  11/02/2023   This document may have been prepared by [facility] Voice Recognition software and as such may include unintentional dictation errors.

## 2023-11-06 ENCOUNTER — Other Ambulatory Visit

## 2023-11-06 DIAGNOSIS — I10 Essential (primary) hypertension: Secondary | ICD-10-CM | POA: Diagnosis not present

## 2023-11-06 DIAGNOSIS — E782 Mixed hyperlipidemia: Secondary | ICD-10-CM | POA: Diagnosis not present

## 2023-11-06 DIAGNOSIS — R7303 Prediabetes: Secondary | ICD-10-CM | POA: Diagnosis not present

## 2023-11-07 ENCOUNTER — Ambulatory Visit: Payer: Self-pay | Admitting: Internal Medicine

## 2023-11-07 LAB — CMP14+EGFR
ALT: 17 IU/L (ref 0–32)
AST: 15 IU/L (ref 0–40)
Albumin: 3.8 g/dL (ref 3.7–4.7)
Alkaline Phosphatase: 84 IU/L (ref 44–121)
BUN/Creatinine Ratio: 13 (ref 12–28)
BUN: 17 mg/dL (ref 8–27)
Bilirubin Total: 0.6 mg/dL (ref 0.0–1.2)
CO2: 23 mmol/L (ref 20–29)
Calcium: 9.3 mg/dL (ref 8.7–10.3)
Chloride: 105 mmol/L (ref 96–106)
Creatinine, Ser: 1.36 mg/dL — ABNORMAL HIGH (ref 0.57–1.00)
Globulin, Total: 2.7 g/dL (ref 1.5–4.5)
Glucose: 98 mg/dL (ref 70–99)
Potassium: 4.4 mmol/L (ref 3.5–5.2)
Sodium: 142 mmol/L (ref 134–144)
Total Protein: 6.5 g/dL (ref 6.0–8.5)
eGFR: 38 mL/min/1.73 — ABNORMAL LOW (ref >59)

## 2023-11-07 LAB — HEMOGLOBIN A1C
Est. average glucose Bld gHb Est-mCnc: 120 mg/dL
Hgb A1c MFr Bld: 5.8 % — ABNORMAL HIGH (ref 4.8–5.6)

## 2023-11-07 LAB — LIPID PANEL W/O CHOL/HDL RATIO
Cholesterol, Total: 133 mg/dL (ref 100–199)
HDL: 40 mg/dL (ref >39)
LDL Chol Calc (NIH): 65 mg/dL (ref 0–99)
Triglycerides: 165 mg/dL — ABNORMAL HIGH (ref 0–149)
VLDL Cholesterol Cal: 28 mg/dL (ref 5–40)

## 2023-11-08 NOTE — Progress Notes (Signed)
Pt informed

## 2024-01-11 ENCOUNTER — Ambulatory Visit: Admitting: Cardiovascular Disease

## 2024-01-11 ENCOUNTER — Encounter: Payer: Self-pay | Admitting: Cardiovascular Disease

## 2024-01-11 VITALS — BP 130/64 | HR 72 | Ht 62.0 in | Wt 164.0 lb

## 2024-01-11 DIAGNOSIS — I5189 Other ill-defined heart diseases: Secondary | ICD-10-CM | POA: Diagnosis not present

## 2024-01-11 DIAGNOSIS — E782 Mixed hyperlipidemia: Secondary | ICD-10-CM

## 2024-01-11 DIAGNOSIS — I10 Essential (primary) hypertension: Secondary | ICD-10-CM | POA: Diagnosis not present

## 2024-01-11 DIAGNOSIS — I2510 Atherosclerotic heart disease of native coronary artery without angina pectoris: Secondary | ICD-10-CM | POA: Diagnosis not present

## 2024-01-11 DIAGNOSIS — I340 Nonrheumatic mitral (valve) insufficiency: Secondary | ICD-10-CM

## 2024-01-11 NOTE — Progress Notes (Signed)
 Cardiology Office Note   Date:  01/11/2024   ID:  Alexa, Coleman [birth year], MRN [id number]  PCP:  Fernand Fredy RAMAN, MD  Cardiologist:  Denyse Fernand, MD      History of Present Illness: Alexa Coleman is a 89 y.o. female who presents for  Chief Complaint  Patient presents with   Follow-up    3 months follow up    No complaints.      Past Medical History:  Diagnosis Date   CAD involving native coronary artery without angina pectoris    High cholesterol    Hypertension      Past Surgical History:  Procedure Laterality Date   ABDOMINAL HYSTERECTOMY     CATARACT EXTRACTION W/PHACO Left 05/22/2023   Procedure: CATARACT EXTRACTION PHACO AND INTRAOCULAR LENS PLACEMENT (IOC) LEFT 7.70, 00:44.9;  Surgeon: Myrna Adine Anes, MD;  Location: [facility];  Service: Ophthalmology;  Laterality: Left;   CATARACT EXTRACTION W/PHACO Right 06/05/2023   Procedure: CATARACT EXTRACTION PHACO AND INTRAOCULAR LENS PLACEMENT (IOC) RIGHT 9.31 00:53.2;  Surgeon: Myrna Adine Anes, MD;  Location: [facility];  Service: Ophthalmology;  Laterality: Right;     Current Outpatient Medications  Medication Sig Dispense Refill   alendronate (FOSAMAX) 70 MG tablet TAKE 1 TABLET EVERY WEEK 12 tablet 3   atorvastatin (LIPITOR) 40 MG tablet TAKE 1 TABLET EVERY DAY 90 tablet 3   clopidogrel (PLAVIX) 75 MG tablet TAKE 1 TABLET EVERY DAY 90 tablet 3   losartan  (COZAAR ) 100 MG tablet Take 1 tablet (100 mg total) by mouth daily. 30 tablet 11   metoprolol  succinate (TOPROL -XL) 50 MG 24 hr tablet TAKE 1 TABLET EVERY DAY 90 tablet 3   Vitamin D , Ergocalciferol , (DRISDOL) 1.25 MG (50000 UNIT) CAPS capsule TAKE 1 CAPSULE ONE TIME WEEKLY 12 capsule 3   No current facility-administered medications for this visit.    Allergies:   Lisinopril and Other    Social History:   reports that she has never smoked. She has never used smokeless tobacco. She reports that she does not drink alcohol and  does not use drugs.   Family History:  family history is not on file.    ROS:     Review of Systems  Constitutional: Negative.   HENT: Negative.    Eyes: Negative.   Respiratory: Negative.    Gastrointestinal: Negative.   Genitourinary: Negative.   Musculoskeletal: Negative.   Skin: Negative.   Neurological: Negative.   Endo/Heme/Allergies: Negative.   Psychiatric/Behavioral: Negative.    All other systems reviewed and are negative.     All other systems are reviewed and negative.    PHYSICAL EXAM: VS:  BP 130/64   Pulse 72   Ht 5' 2 (1.575 m)   Wt 164 lb (74.4 kg)   SpO2 96%   BMI 30.00 kg/m  , BMI Body mass index is 30 kg/m. Last weight:  Wt Readings from Last 3 Encounters:  01/11/24 164 lb (74.4 kg)  11/02/23 164 lb 3.2 oz (74.5 kg)  10/12/23 164 lb (74.4 kg)     Physical Exam Constitutional:      Appearance: Normal appearance.  Cardiovascular:     Rate and Rhythm: Normal rate and regular rhythm.     Heart sounds: Normal heart sounds.  Pulmonary:     Effort: Pulmonary effort is normal.     Breath sounds: Normal breath sounds.  Musculoskeletal:     Right lower leg: No edema.  Left lower leg: No edema.  Neurological:     Mental Status: She is alert.       EKG:   Recent Labs: 11/06/2023: ALT 17; BUN 17; Creatinine, Ser 1.36; Potassium 4.4; Sodium 142    Lipid Panel    Component Value Date/Time   CHOL 133 11/06/2023 0858   TRIG 165 (H) 11/06/2023 0858   HDL 40 11/06/2023 0858   CHOLHDL 3.6 10/25/2022 1215   LDLCALC 65 11/06/2023 0858      Other studies Reviewed: Additional studies/ records that were reviewed today include:  Review of the above records demonstrates:       No data to display            ASSESSMENT AND PLAN:    ICD-10-CM   1. Coronary artery disease involving native coronary artery of native heart without angina pectoris  I25.10    No chest pain or SOB.    2. Nonrheumatic mitral valve regurgitation  I34.0      3. Primary hypertension  I10     4. Mixed hyperlipidemia  E78.2     5. Echocardiogram shows left ventricular diastolic dysfunction  I51.89    But asymptomatic. Could not toreste farxiga.       Problem List Items Addressed This Visit       Cardiovascular and Mediastinum   Primary hypertension   Coronary artery disease involving native coronary artery of native heart without angina pectoris - Primary   Nonrheumatic mitral valve regurgitation     Other   Mixed hyperlipidemia   Other Visit Diagnoses       Echocardiogram shows left ventricular diastolic dysfunction       But asymptomatic. Could not toreste farxiga.          Disposition:   Return in about 3 months (around 04/11/2024).    Total time spent: 30 minutes  Signed,  Denyse Bathe, MD  01/11/2024 1:23 PM    [facility]

## 2024-01-18 ENCOUNTER — Other Ambulatory Visit: Payer: Self-pay | Admitting: Internal Medicine

## 2024-02-27 DIAGNOSIS — R21 Rash and other nonspecific skin eruption: Secondary | ICD-10-CM | POA: Diagnosis not present

## 2024-02-28 ENCOUNTER — Other Ambulatory Visit: Payer: Self-pay | Admitting: Cardiovascular Disease

## 2024-02-28 DIAGNOSIS — I2510 Atherosclerotic heart disease of native coronary artery without angina pectoris: Secondary | ICD-10-CM

## 2024-02-28 DIAGNOSIS — I10 Essential (primary) hypertension: Secondary | ICD-10-CM

## 2024-02-28 DIAGNOSIS — I340 Nonrheumatic mitral (valve) insufficiency: Secondary | ICD-10-CM

## 2024-02-28 DIAGNOSIS — E782 Mixed hyperlipidemia: Secondary | ICD-10-CM

## 2024-02-29 ENCOUNTER — Other Ambulatory Visit: Payer: Self-pay | Admitting: Internal Medicine

## 2024-03-12 ENCOUNTER — Telehealth: Payer: Self-pay | Admitting: Internal Medicine

## 2024-03-12 NOTE — Telephone Encounter (Signed)
 Patient was dx with shingles on 11/11 and has not had a BM in 7 days. Even before 7 days ago she was having small BM. Have tried senna and Miralax with no success. Please advise on what to do.

## 2024-03-15 NOTE — Telephone Encounter (Signed)
 Pt states she is doing better now.

## 2024-04-26 ENCOUNTER — Encounter: Payer: Self-pay | Admitting: Cardiovascular Disease

## 2024-04-26 ENCOUNTER — Ambulatory Visit: Admitting: Cardiovascular Disease

## 2024-04-26 VITALS — BP 132/90 | HR 62 | Ht 60.0 in | Wt 155.8 lb

## 2024-04-26 DIAGNOSIS — I10 Essential (primary) hypertension: Secondary | ICD-10-CM | POA: Diagnosis not present

## 2024-04-26 DIAGNOSIS — I340 Nonrheumatic mitral (valve) insufficiency: Secondary | ICD-10-CM

## 2024-04-26 DIAGNOSIS — I2510 Atherosclerotic heart disease of native coronary artery without angina pectoris: Secondary | ICD-10-CM | POA: Diagnosis not present

## 2024-04-26 DIAGNOSIS — E782 Mixed hyperlipidemia: Secondary | ICD-10-CM | POA: Diagnosis not present

## 2024-04-26 MED ORDER — AMLODIPINE BESYLATE 5 MG PO TABS: 5.0000 mg | ORAL_TABLET | Freq: Every day | ORAL | 11 refills | Status: DC

## 2024-04-26 NOTE — Progress Notes (Signed)
 "     Cardiology Office Note   Date:  04/26/2024   ID:  Alexa Coleman, DOB May 28, 1933, MRN [id number]  PCP:  Fernand Fredy RAMAN, MD  Cardiologist:  Denyse Fernand, MD      History of Present Illness: Alexa Coleman is a 89 y.o. female who presents for  Chief Complaint  Patient presents with   Follow-up    3 month follow up    Feels fine, as BP high, but normally low.      Past Medical History:  Diagnosis Date   CAD involving native coronary artery without angina pectoris    High cholesterol    Hypertension      Past Surgical History:  Procedure Laterality Date   ABDOMINAL HYSTERECTOMY     CATARACT EXTRACTION W/PHACO Left 05/22/2023   Procedure: CATARACT EXTRACTION PHACO AND INTRAOCULAR LENS PLACEMENT (IOC) LEFT 7.70, 00:44.9;  Surgeon: Myrna Adine Anes, MD;  Location: [facility];  Service: Ophthalmology;  Laterality: Left;   CATARACT EXTRACTION W/PHACO Right 06/05/2023   Procedure: CATARACT EXTRACTION PHACO AND INTRAOCULAR LENS PLACEMENT (IOC) RIGHT 9.31 00:53.2;  Surgeon: Myrna Adine Anes, MD;  Location: [facility];  Service: Ophthalmology;  Laterality: Right;     Current Outpatient Medications  Medication Sig Dispense Refill   alendronate (FOSAMAX) 70 MG tablet TAKE 1 TABLET EVERY WEEK 12 tablet 3   amLODipine  (NORVASC ) 5 MG tablet Take 1 tablet (5 mg total) by mouth daily. 30 tablet 11   amLODipine  (NORVASC ) 5 MG tablet Take 1 tablet (5 mg total) by mouth daily. 30 tablet 11   atorvastatin (LIPITOR) 40 MG tablet TAKE 1 TABLET EVERY DAY 90 tablet 3   clopidogrel (PLAVIX) 75 MG tablet TAKE 1 TABLET EVERY DAY 90 tablet 3   losartan  (COZAAR ) 100 MG tablet TAKE 1 TABLET EVERY DAY 90 tablet 3   metoprolol  succinate (TOPROL -XL) 50 MG 24 hr tablet TAKE 1 TABLET EVERY DAY 90 tablet 3   Vitamin D , Ergocalciferol , (DRISDOL) 1.25 MG (50000 UNIT) CAPS capsule TAKE 1 CAPSULE ONE TIME WEEKLY 12 capsule 3   No current facility-administered medications for this visit.     Allergies:   Lisinopril and Other    Social History:   reports that she has never smoked. She has never used smokeless tobacco. She reports that she does not drink alcohol and does not use drugs.   Family History:  family history is not on file.    ROS:     Review of Systems  Constitutional: Negative.   HENT: Negative.    Eyes: Negative.   Respiratory: Negative.    Gastrointestinal: Negative.   Genitourinary: Negative.   Musculoskeletal: Negative.   Skin: Negative.   Neurological: Negative.   Endo/Heme/Allergies: Negative.   Psychiatric/Behavioral: Negative.    All other systems reviewed and are negative.     All other systems are reviewed and negative.    PHYSICAL EXAM: VS:  BP (!) 132/90   Pulse 62   Ht 5' (1.524 m)   Wt 155 lb 12.8 oz (70.7 kg)   SpO2 97%   BMI 30.43 kg/m  , BMI Body mass index is 30.43 kg/m. Last weight:  Wt Readings from Last 3 Encounters:  04/26/24 155 lb 12.8 oz (70.7 kg)  01/11/24 164 lb (74.4 kg)  11/02/23 164 lb 3.2 oz (74.5 kg)     Physical Exam Constitutional:      Appearance: Normal appearance.  Cardiovascular:     Rate and Rhythm: Normal  rate and regular rhythm.     Heart sounds: Normal heart sounds.  Pulmonary:     Effort: Pulmonary effort is normal.     Breath sounds: Normal breath sounds.  Musculoskeletal:     Right lower leg: No edema.     Left lower leg: No edema.  Neurological:     Mental Status: She is alert.       EKG:   Recent Labs: 11/06/2023: ALT 17; BUN 17; Creatinine, Ser 1.36; Potassium 4.4; Sodium 142    Lipid Panel    Component Value Date/Time   CHOL 133 11/06/2023 0858   TRIG 165 (H) 11/06/2023 0858   HDL 40 11/06/2023 0858   CHOLHDL 3.6 10/25/2022 1215   LDLCALC 65 11/06/2023 0858      Other studies Reviewed: Additional studies/ records that were reviewed today include:  Review of the above records demonstrates:       No data to display            ASSESSMENT AND PLAN:     ICD-10-CM   1. Primary hypertension  I10 amLODipine  (NORVASC ) 5 MG tablet    amLODipine  (NORVASC ) 5 MG tablet   Amlodapine 5 mg daily as diastolic BP 90.    2. Coronary artery disease involving native coronary artery of native heart without angina pectoris  I25.10 amLODipine  (NORVASC ) 5 MG tablet    amLODipine  (NORVASC ) 5 MG tablet    3. Nonrheumatic mitral valve regurgitation  I34.0 amLODipine  (NORVASC ) 5 MG tablet    amLODipine  (NORVASC ) 5 MG tablet   Mild normal LVEF, has grade 1 diastolic dysfunction.    4. Mixed hyperlipidemia  E78.2 amLODipine  (NORVASC ) 5 MG tablet    amLODipine  (NORVASC ) 5 MG tablet       Problem List Items Addressed This Visit       Cardiovascular and Mediastinum   Primary hypertension - Primary   Relevant Medications   amLODipine  (NORVASC ) 5 MG tablet   amLODipine  (NORVASC ) 5 MG tablet   Coronary artery disease involving native coronary artery of native heart without angina pectoris   Relevant Medications   amLODipine  (NORVASC ) 5 MG tablet   amLODipine  (NORVASC ) 5 MG tablet   Nonrheumatic mitral valve regurgitation   Relevant Medications   amLODipine  (NORVASC ) 5 MG tablet   amLODipine  (NORVASC ) 5 MG tablet     Other   Mixed hyperlipidemia   Relevant Medications   amLODipine  (NORVASC ) 5 MG tablet   amLODipine  (NORVASC ) 5 MG tablet       Disposition:   Return in about 4 weeks (around 05/24/2024).    Total time spent: 35 minutes  Signed,  Denyse Bathe, MD  04/26/2024 2:20 PM    [facility] "

## 2024-05-06 ENCOUNTER — Ambulatory Visit: Admitting: Internal Medicine

## 2024-05-06 ENCOUNTER — Encounter: Payer: Self-pay | Admitting: Internal Medicine

## 2024-05-06 VITALS — BP 130/82 | HR 70 | Ht 62.0 in | Wt 157.6 lb

## 2024-05-06 DIAGNOSIS — R7303 Prediabetes: Secondary | ICD-10-CM | POA: Diagnosis not present

## 2024-05-06 DIAGNOSIS — Z13820 Encounter for screening for osteoporosis: Secondary | ICD-10-CM | POA: Insufficient documentation

## 2024-05-06 DIAGNOSIS — I10 Essential (primary) hypertension: Secondary | ICD-10-CM | POA: Diagnosis not present

## 2024-05-06 DIAGNOSIS — E782 Mixed hyperlipidemia: Secondary | ICD-10-CM

## 2024-05-06 DIAGNOSIS — E663 Overweight: Secondary | ICD-10-CM

## 2024-05-06 DIAGNOSIS — Z6828 Body mass index (BMI) 28.0-28.9, adult: Secondary | ICD-10-CM | POA: Diagnosis not present

## 2024-05-06 DIAGNOSIS — E559 Vitamin D deficiency, unspecified: Secondary | ICD-10-CM | POA: Diagnosis not present

## 2024-05-06 NOTE — Progress Notes (Signed)
 "  Established Patient Office Visit  Subjective:  Patient ID: Alexa Coleman, female    DOB: [birth year]  Age: 89 y.o. MRN: [id number]  Chief Complaint  Patient presents with   Follow-up    6 month follow up    Patient is here for follow up.  Her blood pressure is elevated today. Her Cardiologist put her on Amlodipine  for hypertension but it caused bilateral ankle edema. She stopped taking it and her symptoms resolved; her BP is still elevated today. She is only taking Losartan  100 mg at this time. Denies headache or dizziness, chest pain, shortness of breath, palpitations. Upon recheck BP is: 130/80. Discussed starting Hydralazine in the future if BP remains elevated. She is scheduled to see Cardiology in 2 weeks.  She reports in 02/2024 she had rash on her buttocks. She went to Urgent care and they treated her for shingles/cellulitis. She reports she is feeling better and the rash has since resolved. She reports she never got the shingles vaccines.  She is due for routine blood work today and will get it collected after today's visit. She does not need routine colon cancer screening based off her age. Her last mammogram was 02/2023. DEXA scan has not been completed in the last 2 years although the last one showed no signs of osteoporosis at that time patient is taking Fosamax once weekly. Will order DEXA to be completed.   Patient is due for Medicare AWV.    No other concerns at this time.   Past Medical History:  Diagnosis Date   CAD involving native coronary artery without angina pectoris    High cholesterol    Hypertension     Past Surgical History:  Procedure Laterality Date   ABDOMINAL HYSTERECTOMY     CATARACT EXTRACTION W/PHACO Left 05/22/2023   Procedure: CATARACT EXTRACTION PHACO AND INTRAOCULAR LENS PLACEMENT (IOC) LEFT 7.70, 00:44.9;  Surgeon: Myrna Adine Anes, MD;  Location: [facility];  Service: Ophthalmology;  Laterality: Left;   CATARACT EXTRACTION W/PHACO  Right 06/05/2023   Procedure: CATARACT EXTRACTION PHACO AND INTRAOCULAR LENS PLACEMENT (IOC) RIGHT 9.31 00:53.2;  Surgeon: Myrna Adine Anes, MD;  Location: [facility];  Service: Ophthalmology;  Laterality: Right;    Social History   Socioeconomic History   Marital status: Married    Spouse name: Not on file   Number of children: Not on file   Years of education: Not on file   Highest education level: Not on file  Occupational History   Not on file  Tobacco Use   Smoking status: Never   Smokeless tobacco: Never  Vaping Use   Vaping status: Never Used  Substance and Sexual Activity   Alcohol use: Never   Drug use: Never   Sexual activity: Not on file  Other Topics Concern   Not on file  Social History Narrative   Not on file   Social Drivers of Health   Tobacco Use: Low Risk (05/06/2024)   Patient History    Smoking Tobacco Use: Never    Smokeless Tobacco Use: Never    Passive Exposure: Not on file  Financial Resource Strain: Low Risk  (02/27/2024)   Received from [facility]   Overall Financial Resource Strain (CARDIA)    Difficulty of Paying Living Expenses: Not hard at all  Food Insecurity: No Food Insecurity (02/27/2024)   Received from [facility]   Epic    Within the past 12 months, you worried that your  food would run out before you got the money to buy more.: Never true    Within the past 12 months, the food you bought just didn't last and you didn't have money to get more.: Never true  Transportation Needs: No Transportation Needs (02/27/2024)   Received from [facility] - Transportation    In the past 12 months, has lack of transportation kept you from medical appointments or from getting medications?: No    Lack of Transportation (Non-Medical): No  Physical Activity: Not on file  Stress: Not on file  Social Connections: Not on file  Intimate Partner Violence: Not on file  Depression  (PHQ2-9): Low Risk (05/04/2023)   Depression (PHQ2-9)    PHQ-2 Score: 2  Alcohol Screen: Not on file  Housing: Low Risk  (02/27/2024)   Received from [facility]   Epic    In the last 12 months, was there a time when you were not able to pay the mortgage or rent on time?: No    In the past 12 months, how many times have you moved where you were living?: 0    At any time in the past 12 months, were you homeless or living in a shelter (including now)?: No  Utilities: Not At Risk (02/27/2024)   Received from [facility]   Epic    In the past 12 months has the electric, gas, oil, or water company threatened to shut off services in your home?: No  Health Literacy: Not on file    Family History  Problem Relation Age of Onset   Breast cancer Neg Hx     Allergies[1]  Show/hide medication list[2]  Review of Systems  Constitutional: Negative.  Negative for chills, fever and malaise/fatigue.  HENT: Negative.  Negative for congestion and sore throat.   Eyes: Negative.  Negative for blurred vision and pain.  Respiratory: Negative.  Negative for cough and shortness of breath.   Cardiovascular: Negative.  Negative for chest pain, palpitations and leg swelling.  Gastrointestinal: Negative.  Negative for abdominal pain, blood in stool, constipation, diarrhea, heartburn, melena, nausea and vomiting.  Genitourinary: Negative.  Negative for dysuria, flank pain, frequency and urgency.  Musculoskeletal: Negative.  Negative for joint pain and myalgias.  Skin: Negative.   Neurological: Negative.  Negative for dizziness, tingling, sensory change, weakness and headaches.  Endo/Heme/Allergies: Negative.   Psychiatric/Behavioral: Negative.  Negative for depression and suicidal ideas. The patient is not nervous/anxious.        Objective:   BP 130/82   Pulse 70   Ht 5' 2 (1.575 m)   Wt 157 lb 9.6 oz (71.5 kg)   SpO2 98%   BMI 28.83 kg/m   Vitals:   05/06/24  1306 05/06/24 1324  BP: (!) 170/82 130/82  Pulse: 70   Height: 5' 2 (1.575 m)   Weight: 157 lb 9.6 oz (71.5 kg)   SpO2: 98%   BMI (Calculated): 28.82     Physical Exam Vitals and nursing note reviewed.  Constitutional:      Appearance: Normal appearance.  HENT:     Head: Normocephalic and atraumatic.     Nose: Nose normal.     Mouth/Throat:     Mouth: Mucous membranes are moist.     Pharynx: Oropharynx is clear.  Eyes:     Conjunctiva/sclera: Conjunctivae normal.     Pupils: Pupils are equal, round, and reactive to light.  Cardiovascular:  Rate and Rhythm: Normal rate and regular rhythm.     Pulses: Normal pulses.     Heart sounds: Murmur heard.  Pulmonary:     Effort: Pulmonary effort is normal.     Breath sounds: Normal breath sounds. No wheezing.  Abdominal:     General: Bowel sounds are normal.     Palpations: Abdomen is soft.     Tenderness: There is no abdominal tenderness. There is no right CVA tenderness or left CVA tenderness.  Musculoskeletal:        General: Normal range of motion.     Cervical back: Normal range of motion.     Right lower leg: No edema.     Left lower leg: No edema.  Skin:    General: Skin is warm and dry.  Neurological:     General: No focal deficit present.     Mental Status: She is alert and oriented to person, place, and time.  Psychiatric:        Mood and Affect: Mood normal.        Behavior: Behavior normal.      No results found for any visits on 05/06/24.  No results found for this or any previous visit (from the past 2160 hours).    Assessment & Plan:  Continue medications as prescribed. Check routine blood work and FU with patient on results. DEXA scan ordered. Reinforced healthy diet and exercise as tolerated. Patient due for Medicare AWV. Problem List Items Addressed This Visit       Cardiovascular and Mediastinum   Primary hypertension - Primary   Relevant Orders   CMP14+EGFR   CBC with Diff     Other    Mixed hyperlipidemia   Relevant Orders   CMP14+EGFR   Lipid Panel w/o Chol/HDL Ratio   CBC with Diff   Prediabetes   Relevant Orders   Hemoglobin A1c   Vitamin D  deficiency   Relevant Orders   Vitamin D  (25 hydroxy)   Screening for osteoporosis   Relevant Orders   DG Bone Density   Overweight with body mass index (BMI) of 28 to 28.9 in adult    Return in about 3 weeks (around 05/27/2024) for Medicare AWV.   Total time spent: 30 minutes. This time includes review of previous notes and results and patient face to face interaction during today's visit.    FERNAND FREDY RAMAN, MD  05/06/2024   This document may have been prepared by [facility] Voice Recognition software and as such may include unintentional dictation errors.      [1]  Allergies Allergen Reactions   Amlodipine  Swelling   Lisinopril Cough   Other     Preservatives in eye drops cause eye to be blood red  [2]  Outpatient Medications Prior to Visit  Medication Sig   alendronate (FOSAMAX) 70 MG tablet TAKE 1 TABLET EVERY WEEK   atorvastatin (LIPITOR) 40 MG tablet TAKE 1 TABLET EVERY DAY   clopidogrel (PLAVIX) 75 MG tablet TAKE 1 TABLET EVERY DAY   losartan  (COZAAR ) 100 MG tablet TAKE 1 TABLET EVERY DAY   metoprolol  succinate (TOPROL -XL) 50 MG 24 hr tablet TAKE 1 TABLET EVERY DAY   Vitamin D , Ergocalciferol , (DRISDOL) 1.25 MG (50000 UNIT) CAPS capsule TAKE 1 CAPSULE ONE TIME WEEKLY (Patient not taking: Reported on 05/06/2024)   [DISCONTINUED] amLODipine  (NORVASC ) 5 MG tablet Take 1 tablet (5 mg total) by mouth daily. (Patient not taking: Reported on 05/06/2024)   [DISCONTINUED] amLODipine  (NORVASC ) 5 MG tablet Take  1 tablet (5 mg total) by mouth daily. (Patient not taking: Reported on 05/06/2024)   No facility-administered medications prior to visit.   "

## 2024-05-07 ENCOUNTER — Ambulatory Visit: Payer: Self-pay | Admitting: Internal Medicine

## 2024-05-07 LAB — CBC WITH DIFFERENTIAL/PLATELET
Basophils Absolute: 0.1 x10E3/uL (ref 0.0–0.2)
Basos: 1 %
EOS (ABSOLUTE): 0.3 x10E3/uL (ref 0.0–0.4)
Eos: 3 %
Hematocrit: 43.1 % (ref 34.0–46.6)
Hemoglobin: 14.2 g/dL (ref 11.1–15.9)
Immature Grans (Abs): 0 x10E3/uL (ref 0.0–0.1)
Immature Granulocytes: 0 %
Lymphocytes Absolute: 2.2 x10E3/uL (ref 0.7–3.1)
Lymphs: 23 %
MCH: 32.2 pg (ref 26.6–33.0)
MCHC: 32.9 g/dL (ref 31.5–35.7)
MCV: 98 fL — ABNORMAL HIGH (ref 79–97)
Monocytes Absolute: 0.9 x10E3/uL (ref 0.1–0.9)
Monocytes: 9 %
Neutrophils Absolute: 6.3 x10E3/uL (ref 1.4–7.0)
Neutrophils: 64 %
Platelets: 261 x10E3/uL (ref 150–450)
RBC: 4.41 x10E6/uL (ref 3.77–5.28)
RDW: 13 % (ref 11.7–15.4)
WBC: 9.8 x10E3/uL (ref 3.4–10.8)

## 2024-05-07 LAB — LIPID PANEL W/O CHOL/HDL RATIO
Cholesterol, Total: 149 mg/dL (ref 100–199)
HDL: 46 mg/dL
LDL Chol Calc (NIH): 71 mg/dL (ref 0–99)
Triglycerides: 191 mg/dL — ABNORMAL HIGH (ref 0–149)
VLDL Cholesterol Cal: 32 mg/dL (ref 5–40)

## 2024-05-07 LAB — CMP14+EGFR
ALT: 19 IU/L (ref 0–32)
AST: 19 IU/L (ref 0–40)
Albumin: 4.4 g/dL (ref 3.7–4.7)
Alkaline Phosphatase: 93 IU/L (ref 48–129)
BUN/Creatinine Ratio: 18 (ref 12–28)
BUN: 23 mg/dL (ref 8–27)
Bilirubin Total: 0.6 mg/dL (ref 0.0–1.2)
CO2: 21 mmol/L (ref 20–29)
Calcium: 9.7 mg/dL (ref 8.7–10.3)
Chloride: 106 mmol/L (ref 96–106)
Creatinine, Ser: 1.29 mg/dL — ABNORMAL HIGH (ref 0.57–1.00)
Globulin, Total: 2.9 g/dL (ref 1.5–4.5)
Glucose: 98 mg/dL (ref 70–99)
Potassium: 4.8 mmol/L (ref 3.5–5.2)
Sodium: 143 mmol/L (ref 134–144)
Total Protein: 7.3 g/dL (ref 6.0–8.5)
eGFR: 40 mL/min/1.73 — ABNORMAL LOW

## 2024-05-07 LAB — HEMOGLOBIN A1C
Est. average glucose Bld gHb Est-mCnc: 114 mg/dL
Hgb A1c MFr Bld: 5.6 % (ref 4.8–5.6)

## 2024-05-07 LAB — VITAMIN D 25 HYDROXY (VIT D DEFICIENCY, FRACTURES): Vit D, 25-Hydroxy: 31.6 ng/mL (ref 30.0–100.0)

## 2024-05-08 NOTE — Progress Notes (Signed)
 Patient notified

## 2024-05-15 ENCOUNTER — Ambulatory Visit

## 2024-05-15 DIAGNOSIS — M8589 Other specified disorders of bone density and structure, multiple sites: Secondary | ICD-10-CM

## 2024-05-15 DIAGNOSIS — Z13820 Encounter for screening for osteoporosis: Secondary | ICD-10-CM | POA: Diagnosis not present

## 2024-05-20 ENCOUNTER — Ambulatory Visit: Admitting: Internal Medicine

## 2024-05-24 ENCOUNTER — Ambulatory Visit: Admitting: Cardiovascular Disease

## 2024-05-24 ENCOUNTER — Encounter: Payer: Self-pay | Admitting: Cardiovascular Disease

## 2024-05-24 VITALS — BP 118/78 | HR 83 | Ht 62.0 in | Wt 157.8 lb

## 2024-05-24 DIAGNOSIS — E782 Mixed hyperlipidemia: Secondary | ICD-10-CM

## 2024-05-24 DIAGNOSIS — I340 Nonrheumatic mitral (valve) insufficiency: Secondary | ICD-10-CM

## 2024-05-24 DIAGNOSIS — I10 Essential (primary) hypertension: Secondary | ICD-10-CM

## 2024-05-24 DIAGNOSIS — R000 Tachycardia, unspecified: Secondary | ICD-10-CM

## 2024-05-24 DIAGNOSIS — I2510 Atherosclerotic heart disease of native coronary artery without angina pectoris: Secondary | ICD-10-CM

## 2024-05-24 NOTE — Progress Notes (Signed)
 "     Cardiology Office Note   Date:  05/24/2024   ID:  Alexa Coleman, DOB 03-19-1934, MRN [id number]  PCP:  Fernand Fredy RAMAN, MD  Cardiologist:  Denyse Fernand, MD      History of Present Illness: Alexa Coleman is a 89 y.o. female who presents for  Chief Complaint  Patient presents with   Follow-up    4 week follow up    No chest pains and SOB. Has swelling of legs  due to amlodapine, and stopped it.      Past Medical History:  Diagnosis Date   CAD involving native coronary artery without angina pectoris    High cholesterol    Hypertension      Past Surgical History:  Procedure Laterality Date   ABDOMINAL HYSTERECTOMY     CATARACT EXTRACTION W/PHACO Left 05/22/2023   Procedure: CATARACT EXTRACTION PHACO AND INTRAOCULAR LENS PLACEMENT (IOC) LEFT 7.70, 00:44.9;  Surgeon: Myrna Adine Anes, MD;  Location: [facility];  Service: Ophthalmology;  Laterality: Left;   CATARACT EXTRACTION W/PHACO Right 06/05/2023   Procedure: CATARACT EXTRACTION PHACO AND INTRAOCULAR LENS PLACEMENT (IOC) RIGHT 9.31 00:53.2;  Surgeon: Myrna Adine Anes, MD;  Location: [facility];  Service: Ophthalmology;  Laterality: Right;     Current Outpatient Medications  Medication Sig Dispense Refill   alendronate (FOSAMAX) 70 MG tablet TAKE 1 TABLET EVERY WEEK 12 tablet 3   atorvastatin (LIPITOR) 40 MG tablet TAKE 1 TABLET EVERY DAY 90 tablet 3   clopidogrel (PLAVIX) 75 MG tablet TAKE 1 TABLET EVERY DAY 90 tablet 3   losartan  (COZAAR ) 100 MG tablet TAKE 1 TABLET EVERY DAY 90 tablet 3   metoprolol  succinate (TOPROL -XL) 50 MG 24 hr tablet TAKE 1 TABLET EVERY DAY 90 tablet 3   No current facility-administered medications for this visit.    Allergies:   Amlodipine , Lisinopril, and Other    Social History:   reports that she has never smoked. She has never used smokeless tobacco. She reports that she does not drink alcohol and does not use drugs.   Family History:  family history is not on  file.    ROS:     Review of Systems  Constitutional: Negative.   HENT: Negative.    Eyes: Negative.   Respiratory: Negative.    Gastrointestinal: Negative.   Genitourinary: Negative.   Musculoskeletal: Negative.   Skin: Negative.   Neurological: Negative.   Endo/Heme/Allergies: Negative.   Psychiatric/Behavioral: Negative.    All other systems reviewed and are negative.     All other systems are reviewed and negative.    PHYSICAL EXAM: VS:  BP 118/78   Pulse 83   Ht 5' 2 (1.575 m)   Wt 157 lb 12.8 oz (71.6 kg)   SpO2 96%   BMI 28.86 kg/m  , BMI Body mass index is 28.86 kg/m. Last weight:  Wt Readings from Last 3 Encounters:  05/24/24 157 lb 12.8 oz (71.6 kg)  05/06/24 157 lb 9.6 oz (71.5 kg)  04/26/24 155 lb 12.8 oz (70.7 kg)     Physical Exam Constitutional:      Appearance: Normal appearance.  Cardiovascular:     Rate and Rhythm: Normal rate and regular rhythm.     Heart sounds: Normal heart sounds.  Pulmonary:     Effort: Pulmonary effort is normal.     Breath sounds: Normal breath sounds.  Musculoskeletal:     Right lower leg: No edema.  Left lower leg: No edema.  Neurological:     Mental Status: She is alert.       EKG:   Recent Labs: 05/06/2024: ALT 19; BUN 23; Creatinine, Ser 1.29; Hemoglobin 14.2; Platelets 261; Potassium 4.8; Sodium 143    Lipid Panel    Component Value Date/Time   CHOL 149 05/06/2024 1336   TRIG 191 (H) 05/06/2024 1336   HDL 46 05/06/2024 1336   CHOLHDL 3.6 10/25/2022 1215   LDLCALC 71 05/06/2024 1336      Other studies Reviewed: Additional studies/ records that were reviewed today include:  Review of the above records demonstrates:       No data to display            ASSESSMENT AND PLAN:    ICD-10-CM   1. Primary hypertension  I10    Bp stable off amlodapine and swelling gone as has SE from it.    2. Coronary artery disease involving native coronary artery of native heart without angina  pectoris  I25.10     3. Nonrheumatic mitral valve regurgitation  I34.0     4. Sinus tachycardia seen on cardiac monitor  R00.0     5. Mixed hyperlipidemia  E78.2        Problem List Items Addressed This Visit       Cardiovascular and Mediastinum   Primary hypertension - Primary   Coronary artery disease involving native coronary artery of native heart without angina pectoris   Nonrheumatic mitral valve regurgitation     Other   Mixed hyperlipidemia   Other Visit Diagnoses       Sinus tachycardia seen on cardiac monitor              Disposition:   Return in about 3 months (around 08/21/2024).    Total time spent: 35 minutes  Signed,  Denyse Bathe, MD  05/24/2024 2:42 PM    [facility] "

## 2024-06-04 ENCOUNTER — Ambulatory Visit: Admitting: Internal Medicine

## 2024-08-29 ENCOUNTER — Ambulatory Visit: Admitting: Cardiovascular Disease
